# Patient Record
Sex: Male | Born: 1950 | Race: White | Hispanic: No | Smoking: Former smoker
Health system: Southern US, Community
[De-identification: ages and names within clinical notes are randomized; demographics above are authoritative.]

## PROBLEM LIST (undated history)

## (undated) DIAGNOSIS — C4490 Unspecified malignant neoplasm of skin, unspecified: Secondary | ICD-10-CM

## (undated) DIAGNOSIS — L409 Psoriasis, unspecified: Secondary | ICD-10-CM

## (undated) DIAGNOSIS — E119 Type 2 diabetes mellitus without complications: Secondary | ICD-10-CM

## (undated) DIAGNOSIS — E785 Hyperlipidemia, unspecified: Secondary | ICD-10-CM

## (undated) DIAGNOSIS — Z8739 Personal history of other diseases of the musculoskeletal system and connective tissue: Secondary | ICD-10-CM

## (undated) DIAGNOSIS — I10 Essential (primary) hypertension: Secondary | ICD-10-CM

## (undated) DIAGNOSIS — M1990 Unspecified osteoarthritis, unspecified site: Secondary | ICD-10-CM

## (undated) HISTORY — DX: Unspecified malignant neoplasm of skin, unspecified: C44.90

---

## 2005-07-29 ENCOUNTER — Ambulatory Visit: Payer: Self-pay | Admitting: Family Medicine

## 2007-05-12 ENCOUNTER — Ambulatory Visit: Payer: Self-pay | Admitting: Gastroenterology

## 2012-11-25 ENCOUNTER — Encounter (HOSPITAL_COMMUNITY): Payer: Self-pay | Admitting: Pharmacy Technician

## 2012-11-30 NOTE — H&P (Signed)
TOTAL KNEE ADMISSION H&P  Patient is being admitted for right total knee arthroplasty.  Subjective:  Chief Complaint:right knee pain.  HPI: [name] [name], 62 y.o. male, has a history of pain and functional disability in the right knee due to arthritis and has failed non-surgical conservative treatments for greater than 12 weeks to includeNSAID's and/or analgesics, corticosteriod injections, weight reduction as appropriate and activity modification.  Onset of symptoms was gradual, starting >10 years ago with gradually worsening course since that time. The patient noted no past surgery on the right knee(s).  Patient currently rates pain in the right knee(s) at 7 out of 10 with activity. Patient has night pain, worsening of pain with activity and weight bearing, pain that interferes with activities of daily living, pain with passive range of motion and crepitus.  Patient has evidence of periarticular osteophytes, joint subluxation and joint space narrowing by imaging studies.  There is no active infection.  Past Medical History Chronic Pain Diabetes Mellitus, Type II Gout Hypertension Hypercholesterolemia Osteoarthritis  Past Surgical History No pertinent surgical history  Current outpatient prescriptions: aspirin EC 81 MG tablet, Take 243 mg by mouth as directed. Patient is taking this 2-3 times per day for pain, Disp: , Rfl: ;   HYDROcodone-acetaminophen (NORCO) 7.5-325 MG per tablet, Take 1 tablet by mouth 2 (two) times daily., Disp: , Rfl: ;   ibuprofen (ADVIL,MOTRIN) 200 MG tablet, Take 200 mg by mouth every 6 (six) hours as needed for pain., Disp: , Rfl:  indomethacin (INDOCIN) 25 MG capsule, Take 25 mg by mouth as needed (Gout flare up)., Disp: , Rfl: lisinopril-hydrochlorothiazide (PRINZIDE,ZESTORETIC) 20-25 MG per tablet, Take 1 tablet by mouth daily before breakfast., Disp: , Rfl: ;   metFORMIN (GLUCOPHAGE) 850 MG tablet, Take 850 mg by mouth 2 (two) times daily with a meal., Disp: ,  Rfl: ;   simvastatin (ZOCOR) 40 MG tablet, Take 40 mg by mouth every evening., Disp: , Rfl:   No Known Allergies   History  Substance Use Topics  . Smoking status: Former smoker  . Smokeless tobacco: No  . Alcohol Use: Former drinker    Family History Father: deceased age 43; HTN Mother: living age 43 Brother: COPD  Review of Systems  Constitutional: Negative.   HENT: Positive for tinnitus. Negative for hearing loss, ear pain, nosebleeds, congestion, sore throat, neck pain and ear discharge.   Eyes: Negative.   Respiratory: Negative.  Negative for stridor.   Cardiovascular: Negative.   Gastrointestinal: Negative.   Musculoskeletal: Positive for back pain and joint pain. Negative for myalgias and falls.       Bilateral knee pain, right greater than left  Skin: Negative.   Neurological: Negative.  Negative for headaches.  Endo/Heme/Allergies: Negative.   Psychiatric/Behavioral: Negative.     Objective:  Physical Exam  Constitutional: He is oriented to person, place, and time. He appears well-developed and well-nourished. No distress.  HENT:  Head: Normocephalic and atraumatic.  Right Ear: External ear normal.  Left Ear: External ear normal.  Nose: Nose normal.  Mouth/Throat: Oropharynx is clear and moist.  Eyes: Conjunctivae and EOM are normal.  Neck: Normal range of motion. Neck supple. No tracheal deviation present. No thyromegaly present.  Cardiovascular: Normal rate, regular rhythm, normal heart sounds and intact distal pulses.   No murmur heard. Respiratory: Effort normal and breath sounds normal. No respiratory distress. He has no wheezes. He exhibits no tenderness.  GI: Soft. Bowel sounds are normal. He exhibits no distension and no  mass. There is no tenderness.  Musculoskeletal:       Right hip: Normal.       Left hip: Normal.       Right knee: He exhibits decreased range of motion. He exhibits no effusion and no erythema. Tenderness found. Medial joint line  tenderness noted.       Left knee: He exhibits decreased range of motion. He exhibits no effusion and no erythema. Tenderness found. Medial joint line tenderness noted.       Right lower leg: He exhibits no tenderness and no swelling.       Left lower leg: He exhibits no tenderness and no swelling.  Both knees have genu varus deformity. Right knee 0 to 95 degrees. Left knee 0 to 100 degrees.   Lymphadenopathy:    He has no cervical adenopathy.  Neurological: He is alert and oriented to person, place, and time. He has normal strength and normal reflexes. No sensory deficit.  Skin: No rash noted. He is not diaphoretic. No erythema.  Psychiatric: He has a normal mood and affect. His behavior is normal.    Vitals Weight: 298 lb Height: 68 in Body Surface Area: 2.55 m Body Mass Index: 45.31 kg/m Pulse: 88 (Regular) BP: 128/98 (Sitting, Left Arm, Standard)  Imaging Review Plain radiographs demonstrate severe degenerative joint disease of the right knee(s). The overall alignment issignificant varus. The bone quality appears to be good for age and reported activity level.  Assessment/Plan:  End stage arthritis, right knee   The patient history, physical examination, clinical judgment of the provider and imaging studies are consistent with end stage degenerative joint disease of the right knee(s) and total knee arthroplasty is deemed medically necessary. The treatment options including medical management, injection therapy arthroscopy and arthroplasty were discussed at length. The risks and benefits of total knee arthroplasty were presented and reviewed. The risks due to aseptic loosening, infection, stiffness, patella tracking problems, thromboembolic complications and other imponderables were discussed. The patient acknowledged the explanation, agreed to proceed with the plan and consent was signed. Patient is being admitted for inpatient treatment for surgery, pain control, PT, OT,  prophylactic antibiotics, VTE prophylaxis, progressive ambulation and ADL's and discharge planning. The patient is planning to be discharged home with home health services     [place], [place]

## 2012-12-01 ENCOUNTER — Ambulatory Visit (HOSPITAL_COMMUNITY)
Admission: RE | Admit: 2012-12-01 | Discharge: 2012-12-01 | Disposition: A | Discharge status: Home / Self Care | Payer: BC Managed Care – PPO | Source: Ambulatory Visit | Attending: Orthopedic Surgery | Admitting: Orthopedic Surgery

## 2012-12-01 ENCOUNTER — Encounter (HOSPITAL_COMMUNITY)
Admission: RE | Admit: 2012-12-01 | Discharge: 2012-12-01 | Disposition: A | Discharge status: Home / Self Care | Payer: BC Managed Care – PPO | Source: Ambulatory Visit | Attending: Orthopedic Surgery | Admitting: Orthopedic Surgery

## 2012-12-01 ENCOUNTER — Encounter (HOSPITAL_COMMUNITY): Payer: Self-pay

## 2012-12-01 DIAGNOSIS — E119 Type 2 diabetes mellitus without complications: Secondary | ICD-10-CM | POA: Insufficient documentation

## 2012-12-01 DIAGNOSIS — Z01812 Encounter for preprocedural laboratory examination: Secondary | ICD-10-CM | POA: Insufficient documentation

## 2012-12-01 DIAGNOSIS — I10 Essential (primary) hypertension: Secondary | ICD-10-CM | POA: Insufficient documentation

## 2012-12-01 DIAGNOSIS — Z01818 Encounter for other preprocedural examination: Secondary | ICD-10-CM | POA: Insufficient documentation

## 2012-12-01 DIAGNOSIS — M47812 Spondylosis without myelopathy or radiculopathy, cervical region: Secondary | ICD-10-CM | POA: Insufficient documentation

## 2012-12-01 DIAGNOSIS — M47814 Spondylosis without myelopathy or radiculopathy, thoracic region: Secondary | ICD-10-CM | POA: Insufficient documentation

## 2012-12-01 HISTORY — DX: Essential (primary) hypertension: I10

## 2012-12-01 HISTORY — DX: Unspecified osteoarthritis, unspecified site: M19.90

## 2012-12-01 HISTORY — DX: Type 2 diabetes mellitus without complications: E11.9

## 2012-12-01 LAB — COMPREHENSIVE METABOLIC PANEL
ALT: 12 U/L (ref 0–53)
AST: 24 U/L (ref 0–37)
Albumin: 3.9 g/dL (ref 3.5–5.2)
Alkaline Phosphatase: 54 U/L (ref 39–117)
BUN: 13 mg/dL (ref 6–23)
CO2: 27 mEq/L (ref 19–32)
Calcium: 9.3 mg/dL (ref 8.4–10.5)
Chloride: 103 mEq/L (ref 96–112)
Creatinine, Ser: 0.9 mg/dL (ref 0.50–1.35)
GFR calc Af Amer: >90 mL/min (ref >90)
GFR calc non Af Amer: >90 mL/min (ref >90)
Glucose, Bld: 124 mg/dL — ABNORMAL HIGH (ref 70–99)
Potassium: 4.5 mEq/L (ref 3.5–5.1)
Sodium: 139 mEq/L (ref 135–145)
Total Bilirubin: 0.3 mg/dL (ref 0.3–1.2)
Total Protein: 7.1 g/dL (ref 6.0–8.3)

## 2012-12-01 LAB — URINALYSIS, ROUTINE W REFLEX MICROSCOPIC
Bilirubin Urine: NEGATIVE
Glucose, UA: NEGATIVE mg/dL
Hgb urine dipstick: NEGATIVE
Ketones, ur: NEGATIVE mg/dL
Leukocytes, UA: NEGATIVE
Nitrite: NEGATIVE
Protein, ur: NEGATIVE mg/dL
Specific Gravity, Urine: 1.014 (ref 1.005–1.030)
Urobilinogen, UA: 0.2 mg/dL (ref 0.0–1.0)
pH: 5.5 (ref 5.0–8.0)

## 2012-12-01 LAB — APTT: aPTT: 31 seconds (ref 24–37)

## 2012-12-01 LAB — CBC
HCT: 40.2 % (ref 39.0–52.0)
MCH: 27 pg (ref 26.0–34.0)
MCV: 81 fL (ref 78.0–100.0)
Platelets: 297 10*3/uL (ref 150–400)
RDW: 13.3 % (ref 11.5–15.5)

## 2012-12-01 LAB — PROTIME-INR
INR: 0.92 (ref 0.00–1.49)
Prothrombin Time: 12.3 seconds (ref 11.6–15.2)

## 2012-12-01 LAB — ABO/RH: ABO/RH(D): O POS

## 2012-12-01 NOTE — Patient Instructions (Addendum)
20 MONTEY EBEL  12/01/2012   Your procedure is scheduled on:  3-5 -2014  Report to [facility] at     0900   AM.  Call this number if you have problems the morning of surgery: [phone]  Or Presurgical Testing [phone](Madalen Gavin)      Do not eat food:After Midnight.    Take these medicines the morning of surgery with A SIP OF WATER: Hydrocodone   Do not wear jewelry, make-up or nail polish.  Do not wear lotions, powders, or perfumes. You may wear deodorant.  Do not shave 12 hours prior to first CHG shower(legs and under arms).(face and neck okay.)  Do not bring valuables to the hospital.  Contacts, dentures or bridgework,body piercing,  may not be worn into surgery.  Leave suitcase in the car. After surgery it may be brought to your room.  For patients admitted to the hospital, checkout time is 11:00 AM the day of discharge.   Patients discharged the day of surgery will not be allowed to drive home. Must have responsible person with you x 24 hours once discharged.  Name and phone number of your driver: tyris, eliot- [phone] cell  Special Instructions: CHG(Chlorhedine 4%-"Hibiclens","Betasept","Aplicare") Shower Use Special Wash: see special instructions.(avoid face and genitals)   Please read over the following fact sheets that you were given: MRSA Information, Blood Transfusion fact sheet, Incentive Spirometry Instruction.    Failure to follow these instructions may result in Cancellation of your surgery.   Patient signature_______________________________________________________

## 2012-12-01 NOTE — Progress Notes (Signed)
12/01/12 1456  OBSTRUCTIVE SLEEP APNEA  Have you ever been diagnosed with sleep apnea through a sleep study? No  Do you snore loudly (loud enough to be heard through closed doors)?  0  Do you often feel tired, fatigued, or sleepy during the daytime? 0  Has anyone observed you stop breathing during your sleep? 0  Do you have, or are you being treated for high blood pressure? 1  BMI more than 35 kg/m2? 1  Age over 61 years old? 1  Neck circumference greater than 40 cm/18 inches? 1 (neck 18.5 inches)  Gender: 1  Obstructive Sleep Apnea Score 5  Score 4 or greater  Results sent to PCP (fax sent Dr. Sheppard Penton [phone] [facility])

## 2012-12-01 NOTE — Pre-Procedure Instructions (Addendum)
12-01-12 EKG 9'13/ Stress test-EKG 2'14-reports. CXR done today. 12-01-12 Stop/Bang score 5 faxed Dr. Michelene Gardener. 12-06-12 1255 Patient notified per voice message of surgery time change to 0830 AM, aware to arrive by 0615 AM to Short Stay. W. Kennon Portela

## 2012-12-08 ENCOUNTER — Inpatient Hospital Stay (HOSPITAL_COMMUNITY)
Admission: RE | Admit: 2012-12-08 | Discharge: 2012-12-11 | DRG: 209 | Disposition: A | Discharge status: Home Health | Payer: BC Managed Care – PPO | Source: Ambulatory Visit | Attending: Orthopedic Surgery | Admitting: Orthopedic Surgery

## 2012-12-08 ENCOUNTER — Encounter (HOSPITAL_COMMUNITY)
Admission: RE | Disposition: A | Discharge status: Home Health | Payer: Self-pay | Source: Ambulatory Visit | Attending: Orthopedic Surgery

## 2012-12-08 ENCOUNTER — Inpatient Hospital Stay (HOSPITAL_COMMUNITY): Payer: BC Managed Care – PPO

## 2012-12-08 ENCOUNTER — Encounter (HOSPITAL_COMMUNITY): Payer: Self-pay | Admitting: *Deleted

## 2012-12-08 ENCOUNTER — Inpatient Hospital Stay (HOSPITAL_COMMUNITY): Payer: BC Managed Care – PPO | Admitting: Anesthesiology

## 2012-12-08 ENCOUNTER — Encounter (HOSPITAL_COMMUNITY): Payer: Self-pay | Admitting: Anesthesiology

## 2012-12-08 DIAGNOSIS — Z6841 Body Mass Index (BMI) 40.0 and over, adult: Secondary | ICD-10-CM

## 2012-12-08 DIAGNOSIS — M1710 Unilateral primary osteoarthritis, unspecified knee: Principal | ICD-10-CM | POA: Diagnosis present

## 2012-12-08 DIAGNOSIS — Z87891 Personal history of nicotine dependence: Secondary | ICD-10-CM

## 2012-12-08 DIAGNOSIS — E119 Type 2 diabetes mellitus without complications: Secondary | ICD-10-CM | POA: Diagnosis present

## 2012-12-08 DIAGNOSIS — M24569 Contracture, unspecified knee: Secondary | ICD-10-CM | POA: Diagnosis present

## 2012-12-08 DIAGNOSIS — I10 Essential (primary) hypertension: Secondary | ICD-10-CM | POA: Diagnosis present

## 2012-12-08 DIAGNOSIS — Z96651 Presence of right artificial knee joint: Secondary | ICD-10-CM

## 2012-12-08 HISTORY — PX: TOTAL KNEE ARTHROPLASTY: SHX125

## 2012-12-08 LAB — GLUCOSE, CAPILLARY
Glucose-Capillary: 129 mg/dL — ABNORMAL HIGH (ref 70–99)
Glucose-Capillary: 204 mg/dL — ABNORMAL HIGH (ref 70–99)
Glucose-Capillary: 205 mg/dL — ABNORMAL HIGH (ref 70–99)

## 2012-12-08 LAB — TYPE AND SCREEN
ABO/RH(D): O POS
Antibody Screen: NEGATIVE

## 2012-12-08 SURGERY — ARTHROPLASTY, KNEE, TOTAL
Anesthesia: General | Site: Knee | Laterality: Right | Wound class: Clean

## 2012-12-08 MED ORDER — PHENOL 1.4 % MT LIQD: 1.0000 | OROMUCOSAL | Status: DC | PRN

## 2012-12-08 MED ORDER — OXYCODONE-ACETAMINOPHEN 5-325 MG PO TABS
2.0000 | ORAL_TABLET | ORAL | Status: DC | PRN
  Administered 2012-12-08 – 2012-12-11 (×16): 2 via ORAL
  Filled 2012-12-08 (×16): qty 2

## 2012-12-08 MED ORDER — LISINOPRIL-HYDROCHLOROTHIAZIDE 20-25 MG PO TABS: 1.0000 | ORAL_TABLET | Freq: Every day | ORAL | Status: DC

## 2012-12-08 MED ORDER — ALUM & MAG HYDROXIDE-SIMETH 200-200-20 MG/5ML PO SUSP: 30.0000 mL | ORAL | Status: DC | PRN

## 2012-12-08 MED ORDER — HYDROCODONE-ACETAMINOPHEN 5-325 MG PO TABS
1.0000 | ORAL_TABLET | ORAL | Status: DC | PRN
Start: 2012-12-08 — End: 2012-12-11

## 2012-12-08 MED ORDER — ONDANSETRON HCL 4 MG/2ML IJ SOLN
INTRAMUSCULAR | Status: DC | PRN
  Administered 2012-12-08: 4 mg via INTRAVENOUS

## 2012-12-08 MED ORDER — THROMBIN 5000 UNITS EX SOLR
CUTANEOUS | Status: DC | PRN
  Administered 2012-12-08: 5000 [IU] via TOPICAL

## 2012-12-08 MED ORDER — PROPOFOL 10 MG/ML IV BOLUS
INTRAVENOUS | Status: DC | PRN
  Administered 2012-12-08: 250 mg via INTRAVENOUS

## 2012-12-08 MED ORDER — HYDROMORPHONE HCL PF 1 MG/ML IJ SOLN
0.2500 mg | INTRAMUSCULAR | Status: DC | PRN
  Administered 2012-12-08 (×6): 0.5 mg via INTRAVENOUS

## 2012-12-08 MED ORDER — ONDANSETRON HCL 4 MG/2ML IJ SOLN
4.0000 mg | Freq: Four times a day (QID) | INTRAMUSCULAR | Status: DC | PRN
  Filled 2012-12-08: qty 2

## 2012-12-08 MED ORDER — LACTATED RINGERS IV SOLN
INTRAVENOUS | Status: DC | PRN
  Administered 2012-12-08 (×2): via INTRAVENOUS

## 2012-12-08 MED ORDER — INSULIN ASPART 100 UNIT/ML ~~LOC~~ SOLN
0.0000 [IU] | Freq: Three times a day (TID) | SUBCUTANEOUS | Status: DC
  Administered 2012-12-09 – 2012-12-10 (×5): 3 [IU] via SUBCUTANEOUS
  Administered 2012-12-11: 2 [IU] via SUBCUTANEOUS
  Administered 2012-12-11: 3 [IU] via SUBCUTANEOUS

## 2012-12-08 MED ORDER — MEPERIDINE HCL 50 MG/ML IJ SOLN: 6.2500 mg | INTRAMUSCULAR | Status: DC | PRN

## 2012-12-08 MED ORDER — LABETALOL HCL 5 MG/ML IV SOLN
INTRAVENOUS | Status: DC | PRN
  Administered 2012-12-08 (×2): 5 mg via INTRAVENOUS

## 2012-12-08 MED ORDER — HYDROMORPHONE HCL PF 1 MG/ML IJ SOLN
INTRAMUSCULAR | Status: DC | PRN
  Administered 2012-12-08: 2 mg via INTRAVENOUS

## 2012-12-08 MED ORDER — RIVAROXABAN 10 MG PO TABS
10.0000 mg | ORAL_TABLET | Freq: Every day | ORAL | Status: DC
  Administered 2012-12-09 – 2012-12-11 (×3): 10 mg via ORAL
  Filled 2012-12-08 (×5): qty 1

## 2012-12-08 MED ORDER — MIDAZOLAM HCL 5 MG/5ML IJ SOLN
INTRAMUSCULAR | Status: DC | PRN
  Administered 2012-12-08: 2 mg via INTRAVENOUS

## 2012-12-08 MED ORDER — LACTATED RINGERS IV SOLN: INTRAVENOUS | Status: DC

## 2012-12-08 MED ORDER — SUCCINYLCHOLINE CHLORIDE 20 MG/ML IJ SOLN
INTRAMUSCULAR | Status: DC | PRN
  Administered 2012-12-08: 140 mg via INTRAVENOUS

## 2012-12-08 MED ORDER — CEFAZOLIN SODIUM 1-5 GM-% IV SOLN
1.0000 g | Freq: Four times a day (QID) | INTRAVENOUS | Status: AC
  Administered 2012-12-08 (×2): 1 g via INTRAVENOUS
  Filled 2012-12-08 (×2): qty 50

## 2012-12-08 MED ORDER — SODIUM CHLORIDE 0.9 % IR SOLN
Status: DC | PRN
  Administered 2012-12-08 (×2)

## 2012-12-08 MED ORDER — ACETAMINOPHEN 650 MG RE SUPP: 650.0000 mg | Freq: Four times a day (QID) | RECTAL | Status: DC | PRN

## 2012-12-08 MED ORDER — METHOCARBAMOL 100 MG/ML IJ SOLN: 500.0000 mg | Freq: Four times a day (QID) | INTRAVENOUS | Status: DC | PRN

## 2012-12-08 MED ORDER — ONDANSETRON HCL 4 MG PO TABS: 4.0000 mg | ORAL_TABLET | Freq: Four times a day (QID) | ORAL | Status: DC | PRN

## 2012-12-08 MED ORDER — BISACODYL 10 MG RE SUPP: 10.0000 mg | Freq: Every day | RECTAL | Status: DC | PRN

## 2012-12-08 MED ORDER — FENTANYL CITRATE 0.05 MG/ML IJ SOLN
INTRAMUSCULAR | Status: DC | PRN
  Administered 2012-12-08: 150 ug via INTRAVENOUS
  Administered 2012-12-08: 100 ug via INTRAVENOUS
  Administered 2012-12-08: 50 ug via INTRAVENOUS
  Administered 2012-12-08 (×2): 100 ug via INTRAVENOUS
  Administered 2012-12-08: 50 ug via INTRAVENOUS

## 2012-12-08 MED ORDER — CHLORHEXIDINE GLUCONATE 4 % EX LIQD
60.0000 mL | Freq: Once | CUTANEOUS | Status: DC
  Filled 2012-12-08: qty 60

## 2012-12-08 MED ORDER — LIDOCAINE HCL (CARDIAC) 20 MG/ML IV SOLN
INTRAVENOUS | Status: DC | PRN
  Administered 2012-12-08: 100 mg via INTRAVENOUS

## 2012-12-08 MED ORDER — SODIUM CHLORIDE 0.9 % IR SOLN
Status: DC | PRN
  Administered 2012-12-08: 3000 mL

## 2012-12-08 MED ORDER — ACETAMINOPHEN 10 MG/ML IV SOLN
INTRAVENOUS | Status: DC | PRN
  Administered 2012-12-08: 1000 mg via INTRAVENOUS

## 2012-12-08 MED ORDER — LISINOPRIL 20 MG PO TABS
20.0000 mg | ORAL_TABLET | Freq: Every day | ORAL | Status: DC
  Administered 2012-12-08 – 2012-12-11 (×4): 20 mg via ORAL
  Filled 2012-12-08 (×4): qty 1

## 2012-12-08 MED ORDER — FERROUS SULFATE 325 (65 FE) MG PO TABS
325.0000 mg | ORAL_TABLET | Freq: Three times a day (TID) | ORAL | Status: DC
  Administered 2012-12-08 – 2012-12-11 (×8): 325 mg via ORAL
  Filled 2012-12-08 (×11): qty 1

## 2012-12-08 MED ORDER — LACTATED RINGERS IV SOLN
INTRAVENOUS | Status: DC
  Administered 2012-12-08: 23:00:00 via INTRAVENOUS
  Administered 2012-12-08: 100 mL/h via INTRAVENOUS
  Administered 2012-12-09: 09:00:00 via INTRAVENOUS

## 2012-12-08 MED ORDER — MENTHOL 3 MG MT LOZG: 1.0000 | LOZENGE | OROMUCOSAL | Status: DC | PRN

## 2012-12-08 MED ORDER — FLEET ENEMA 7-19 GM/118ML RE ENEM: 1.0000 | ENEMA | Freq: Once | RECTAL | Status: AC | PRN

## 2012-12-08 MED ORDER — METFORMIN HCL 850 MG PO TABS
850.0000 mg | ORAL_TABLET | Freq: Two times a day (BID) | ORAL | Status: DC
  Administered 2012-12-08 – 2012-12-11 (×6): 850 mg via ORAL
  Filled 2012-12-08 (×8): qty 1

## 2012-12-08 MED ORDER — HYDROMORPHONE HCL PF 1 MG/ML IJ SOLN
1.0000 mg | INTRAMUSCULAR | Status: DC | PRN
  Administered 2012-12-08 – 2012-12-10 (×10): 1 mg via INTRAVENOUS
  Filled 2012-12-08 (×11): qty 1

## 2012-12-08 MED ORDER — HYDROCHLOROTHIAZIDE 25 MG PO TABS
25.0000 mg | ORAL_TABLET | Freq: Every day | ORAL | Status: DC
  Administered 2012-12-08 – 2012-12-11 (×4): 25 mg via ORAL
  Filled 2012-12-08 (×4): qty 1

## 2012-12-08 MED ORDER — SODIUM CHLORIDE 0.9 % IJ SOLN
INTRAMUSCULAR | Status: DC | PRN
  Administered 2012-12-08: 11:00:00

## 2012-12-08 MED ORDER — DEXTROSE 5 % IV SOLN
3.0000 g | INTRAVENOUS | Status: AC
  Administered 2012-12-08: 3 g via INTRAVENOUS
  Filled 2012-12-08: qty 3000

## 2012-12-08 MED ORDER — METHOCARBAMOL 500 MG PO TABS
500.0000 mg | ORAL_TABLET | Freq: Four times a day (QID) | ORAL | Status: DC | PRN
  Administered 2012-12-08 – 2012-12-11 (×11): 500 mg via ORAL
  Filled 2012-12-08 (×10): qty 1

## 2012-12-08 MED ORDER — ACETAMINOPHEN 325 MG PO TABS: 650.0000 mg | ORAL_TABLET | Freq: Four times a day (QID) | ORAL | Status: DC | PRN

## 2012-12-08 MED ORDER — BUPIVACAINE LIPOSOME 1.3 % IJ SUSP
20.0000 mL | Freq: Once | INTRAMUSCULAR | Status: DC
  Filled 2012-12-08: qty 20

## 2012-12-08 MED ORDER — SIMVASTATIN 40 MG PO TABS
40.0000 mg | ORAL_TABLET | Freq: Every day | ORAL | Status: DC
  Administered 2012-12-08 – 2012-12-10 (×3): 40 mg via ORAL
  Filled 2012-12-08 (×4): qty 1

## 2012-12-08 MED ORDER — POLYETHYLENE GLYCOL 3350 17 G PO PACK: 17.0000 g | PACK | Freq: Every day | ORAL | Status: DC | PRN

## 2012-12-08 MED ORDER — PROMETHAZINE HCL 25 MG/ML IJ SOLN: 6.2500 mg | INTRAMUSCULAR | Status: DC | PRN

## 2012-12-08 SURGICAL SUPPLY — 67 items
BAG ZIPLOCK 12X15 (MISCELLANEOUS) ×2 IMPLANT
BANDAGE ELASTIC 4 VELCRO ST LF (GAUZE/BANDAGES/DRESSINGS) ×2 IMPLANT
BANDAGE ELASTIC 6 VELCRO ST LF (GAUZE/BANDAGES/DRESSINGS) ×2 IMPLANT
BANDAGE ESMARK 6X9 LF (GAUZE/BANDAGES/DRESSINGS) ×1 IMPLANT
BANDAGE GAUZE ELAST BULKY 4 IN (GAUZE/BANDAGES/DRESSINGS) ×2 IMPLANT
BLADE SAG 18X100X1.27 (BLADE) ×2 IMPLANT
BLADE SAW SGTL 11.0X1.19X90.0M (BLADE) ×2 IMPLANT
BNDG ESMARK 6X9 LF (GAUZE/BANDAGES/DRESSINGS) ×2
BONE CEMENT GENTAMICIN (Cement) ×4 IMPLANT
CEMENT BONE GENTAMICIN 40 (Cement) ×2 IMPLANT
CLOTH BEACON ORANGE TIMEOUT ST (SAFETY) ×2 IMPLANT
CLSR STERI-STRIP ANTIMIC 1/2X4 (GAUZE/BANDAGES/DRESSINGS) ×4 IMPLANT
CUFF TOURN SGL QUICK 34 (TOURNIQUET CUFF) ×1
CUFF TRNQT CYL 34X4X40X1 (TOURNIQUET CUFF) ×1 IMPLANT
DRAPE EXTREMITY T 121X128X90 (DRAPE) ×2 IMPLANT
DRAPE INCISE IOBAN 66X45 STRL (DRAPES) ×2 IMPLANT
DRAPE LG THREE QUARTER DISP (DRAPES) ×2 IMPLANT
DRAPE POUCH INSTRU U-SHP 10X18 (DRAPES) ×2 IMPLANT
DRAPE U-SHAPE 47X51 STRL (DRAPES) ×2 IMPLANT
DRSG ADAPTIC 3X8 NADH LF (GAUZE/BANDAGES/DRESSINGS) ×2 IMPLANT
DRSG PAD ABDOMINAL 8X10 ST (GAUZE/BANDAGES/DRESSINGS) ×2 IMPLANT
DURAPREP 26ML APPLICATOR (WOUND CARE) ×2 IMPLANT
ELECT BLADE TIP CTD 4 INCH (ELECTRODE) ×2 IMPLANT
ELECT REM PT RETURN 9FT ADLT (ELECTROSURGICAL) ×2
ELECTRODE REM PT RTRN 9FT ADLT (ELECTROSURGICAL) ×1 IMPLANT
EVACUATOR 1/8 PVC DRAIN (DRAIN) ×2 IMPLANT
FACESHIELD LNG OPTICON STERILE (SAFETY) ×16 IMPLANT
GLOVE BIOGEL PI IND STRL 7.0 (GLOVE) ×3 IMPLANT
GLOVE BIOGEL PI IND STRL 8 (GLOVE) ×1 IMPLANT
GLOVE BIOGEL PI IND STRL 8.5 (GLOVE) IMPLANT
GLOVE BIOGEL PI INDICATOR 7.0 (GLOVE) ×3
GLOVE BIOGEL PI INDICATOR 8 (GLOVE) ×1
GLOVE BIOGEL PI INDICATOR 8.5 (GLOVE)
GLOVE ECLIPSE 8.0 STRL XLNG CF (GLOVE) ×4 IMPLANT
GLOVE SURG SS PI 6.5 STRL IVOR (GLOVE) ×6 IMPLANT
GLOVE SURG SS PI 7.0 STRL IVOR (GLOVE) ×4 IMPLANT
GOWN PREVENTION PLUS LG XLONG (DISPOSABLE) ×6 IMPLANT
GOWN STRL REIN XL XLG (GOWN DISPOSABLE) ×2 IMPLANT
HANDPIECE INTERPULSE COAX TIP (DISPOSABLE) ×1
IMMOBILIZER KNEE 20 (SOFTGOODS) ×2
IMMOBILIZER KNEE 20 THIGH 36 (SOFTGOODS) ×1 IMPLANT
KIT BASIN OR (CUSTOM PROCEDURE TRAY) ×2 IMPLANT
MANIFOLD NEPTUNE II (INSTRUMENTS) ×2 IMPLANT
NEEDLE HYPO 22GX1.5 SAFETY (NEEDLE) ×4 IMPLANT
NS IRRIG 1000ML POUR BTL (IV SOLUTION) ×2 IMPLANT
PACK TOTAL JOINT (CUSTOM PROCEDURE TRAY) ×2 IMPLANT
POSITIONER SURGICAL ARM (MISCELLANEOUS) ×2 IMPLANT
SET HNDPC FAN SPRY TIP SCT (DISPOSABLE) ×1 IMPLANT
SPONGE GAUZE 4X4 12PLY (GAUZE/BANDAGES/DRESSINGS) ×2 IMPLANT
SPONGE LAP 18X18 X RAY DECT (DISPOSABLE) ×6 IMPLANT
SPONGE SURGIFOAM ABS GEL 100 (HEMOSTASIS) ×2 IMPLANT
STAPLER VISISTAT 35W (STAPLE) IMPLANT
SUCTION FRAZIER 12FR DISP (SUCTIONS) ×2 IMPLANT
SUT BONE WAX W31G (SUTURE) ×2 IMPLANT
SUT MNCRL AB 4-0 PS2 18 (SUTURE) ×2 IMPLANT
SUT VIC AB 1 CT1 27 (SUTURE) ×2
SUT VIC AB 1 CT1 27XBRD ANTBC (SUTURE) ×2 IMPLANT
SUT VIC AB 2-0 CT1 27 (SUTURE) ×3
SUT VIC AB 2-0 CT1 TAPERPNT 27 (SUTURE) ×3 IMPLANT
SUT VLOC 180 0 24IN GS25 (SUTURE) ×2 IMPLANT
SYR 20CC LL (SYRINGE) ×2 IMPLANT
TOWEL OR 17X26 10 PK STRL BLUE (TOWEL DISPOSABLE) ×6 IMPLANT
TOWER CARTRIDGE SMART MIX (DISPOSABLE) ×2 IMPLANT
TRAY FOLEY CATH 14FRSI W/METER (CATHETERS) ×2 IMPLANT
TRAY TIB SZ 5 REVISION (Knees) ×2 IMPLANT
WATER STERILE IRR 1500ML POUR (IV SOLUTION) ×4 IMPLANT
WRAP KNEE MAXI GEL POST OP (GAUZE/BANDAGES/DRESSINGS) ×4 IMPLANT

## 2012-12-08 NOTE — Progress Notes (Signed)
Utilization review completed.  

## 2012-12-08 NOTE — Anesthesia Preprocedure Evaluation (Addendum)
Anesthesia Evaluation  Patient identified by MRN, date of birth, ID band Patient awake    Reviewed: Allergy & Precautions, H&P , NPO status , Patient's Chart, lab work & pertinent test results  Airway Mallampati: II TM Distance: >3 FB Neck ROM: Full    Dental no notable dental hx.    Pulmonary neg pulmonary ROS, former smoker,  breath sounds clear to auscultation  Pulmonary exam normal       Cardiovascular hypertension, Pt. on medications Rhythm:Regular Rate:Normal     Neuro/Psych negative neurological ROS  negative psych ROS   GI/Hepatic negative GI ROS, Neg liver ROS,   Endo/Other  diabetes, Oral Hypoglycemic AgentsMorbid obesity  Renal/GU negative Renal ROS  negative genitourinary   Musculoskeletal negative musculoskeletal ROS (+)   Abdominal   Peds negative pediatric ROS (+)  Hematology negative hematology ROS (+)   Anesthesia Other Findings   Reproductive/Obstetrics negative OB ROS                         Anesthesia Physical Anesthesia Plan  ASA: III  Anesthesia Plan: General   Post-op Pain Management:    Induction: Intravenous  Airway Management Planned: Oral ETT  Additional Equipment:   Intra-op Plan:   Post-operative Plan: Extubation in OR  Informed Consent: I have reviewed the patients History and Physical, chart, labs and discussed the procedure including the risks, benefits and alternatives for the proposed anesthesia with the patient or authorized representative who has indicated his/her understanding and acceptance.   Dental advisory given  Plan Discussed with: CRNA  Anesthesia Plan Comments:         Anesthesia Quick Evaluation

## 2012-12-08 NOTE — Preoperative (Signed)
Beta Blockers   Reason not to administer Beta Blockers:Not Applicable 

## 2012-12-08 NOTE — Anesthesia Postprocedure Evaluation (Signed)
  Anesthesia Post-op Note  Patient: [name] [name]  Procedure(s) Performed: Procedure(s) (LRB): TOTAL KNEE ARTHROPLASTY (Right)  Patient Location: PACU  Anesthesia Type: General  Level of Consciousness: awake and alert   Airway and Oxygen Therapy: Patient Spontanous Breathing  Post-op Pain: mild  Post-op Assessment: Post-op Vital signs reviewed, Patient's Cardiovascular Status Stable, Respiratory Function Stable, Patent Airway and No signs of Nausea or vomiting  Last Vitals:  Filed Vitals:   12/08/12 0804  BP: 152/93  Temp: 36.4 C  Resp: 20    Post-op Vital Signs: stable   Complications: No apparent anesthesia complications

## 2012-12-08 NOTE — Brief Op Note (Signed)
,  12/08/2012  11:17 AM  PATIENT:  [name] [name]  62 y.o. male  PRE-OPERATIVE DIAGNOSIS:  OA RIGHT KNEE,Complex   POST-OPERATIVE DIAGNOSIS:  OA RIGHT KNEE ,Complex  PROCEDURE:  Procedure(s): TOTAL KNEE ARTHROPLASTY (Right)  SURGEON:  Surgeon(s) and Role:    * Jacki Cones, MD - Primary  PHYSICIAN ASSISTANT: Dimitri Ped PA  ASSISTANTS: Dimitri Ped PA  ANESTHESIA:   general  EBL:  Total I/O In: 1000 [I.V.:1000] Out: 100 [Urine:100]  BLOOD ADMINISTERED:none  DRAINS: (One) Hemovact drain(s) in the Right Knee with  Suction Open   LOCAL MEDICATIONS USED: Bupivicaine 20cc mixed with 20 cc of Normal Saline.  SPECIMEN:  No Specimen  DISPOSITION OF SPECIMEN:  N/A  COUNTS:  YES  TOURNIQUET:  * Missing tourniquet times found for documented tourniquets in log:  85552 *  DICTATION: .Other Dictation: Dictation Number [id number]  PLAN OF CARE: Admit to inpatient   PATIENT DISPOSITION:  Stable in OR   Delay start of Pharmacological VTE agent (>24hrs) due to surgical blood loss or risk of bleeding: yes

## 2012-12-08 NOTE — Transfer of Care (Signed)
Immediate Anesthesia Transfer of Care Note  Patient: [name] [name]  Procedure(s) Performed: Procedure(s): TOTAL KNEE ARTHROPLASTY (Right)  Patient Location: PACU  Anesthesia Type:General  Level of Consciousness: awake, alert , oriented, patient cooperative and responds to stimulation  Airway & Oxygen Therapy: Patient Spontanous Breathing and Patient connected to face mask oxygen  Post-op Assessment: Report given to PACU RN and Post -op Vital signs reviewed and stable  Post vital signs: Reviewed and stable  Complications: No apparent anesthesia complications

## 2012-12-08 NOTE — Progress Notes (Signed)
PT Cancellation Note  Patient Details Name: CUTLER SUNDAY MRN: [id number] DOB: 1951/02/03   Cancelled Treatment:    Reason Eval/Treat Not Completed: Pain limiting ability to participate   Rada Hay 12/08/2012, 4:03 PM

## 2012-12-08 NOTE — Interval H&P Note (Signed)
History and Physical Interval Note:  12/08/2012 8:35 AM  [name] [name]  has presented today for surgery, with the diagnosis of OA RIGHT KNEE   The various methods of treatment have been discussed with the patient and family. After consideration of risks, benefits and other options for treatment, the patient has consented to  Procedure(s): TOTAL KNEE ARTHROPLASTY (Right) as a surgical intervention .  The patient's history has been reviewed, patient examined, no change in status, stable for surgery.  I have reviewed the patient's chart and labs.  Questions were answered to the patient's satisfaction.     GIOFFRE,RONALD A

## 2012-12-09 ENCOUNTER — Encounter (HOSPITAL_COMMUNITY): Payer: Self-pay | Admitting: Orthopedic Surgery

## 2012-12-09 LAB — GLUCOSE, CAPILLARY
Glucose-Capillary: 190 mg/dL — ABNORMAL HIGH (ref 70–99)
Glucose-Capillary: 182 mg/dL — ABNORMAL HIGH (ref 70–99)
Glucose-Capillary: 188 mg/dL — ABNORMAL HIGH (ref 70–99)
Glucose-Capillary: 178 mg/dL — ABNORMAL HIGH (ref 70–99)

## 2012-12-09 LAB — BASIC METABOLIC PANEL
BUN: 8 mg/dL (ref 6–23)
CO2: 29 mEq/L (ref 19–32)
Chloride: 95 mEq/L — ABNORMAL LOW (ref 96–112)
GFR calc Af Amer: >90 mL/min (ref >90)
Potassium: 4.1 mEq/L (ref 3.5–5.1)

## 2012-12-09 LAB — CBC
HCT: 34.1 % — ABNORMAL LOW (ref 39.0–52.0)
Platelets: 229 10*3/uL (ref 150–400)
RBC: 4.18 MIL/uL — ABNORMAL LOW (ref 4.22–5.81)
RDW: 13.4 % (ref 11.5–15.5)
WBC: 7.1 10*3/uL (ref 4.0–10.5)

## 2012-12-09 MED ORDER — FERROUS SULFATE 325 (65 FE) MG PO TABS: 325.0000 mg | ORAL_TABLET | Freq: Three times a day (TID) | ORAL | Status: DC

## 2012-12-09 MED ORDER — METHOCARBAMOL 500 MG PO TABS: 500.0000 mg | ORAL_TABLET | Freq: Four times a day (QID) | ORAL | Status: DC | PRN

## 2012-12-09 MED ORDER — OXYCODONE-ACETAMINOPHEN 5-325 MG PO TABS: 2.0000 | ORAL_TABLET | ORAL | Status: DC | PRN

## 2012-12-09 MED ORDER — RIVAROXABAN 10 MG PO TABS: 10.0000 mg | ORAL_TABLET | Freq: Every day | ORAL | Status: DC

## 2012-12-09 NOTE — Evaluation (Signed)
Physical Therapy Evaluation Patient Details Name: [name] [name] MRN: [id number] DOB: 12-28-1950 Today's Date: 12/09/2012 Time: 0850-0910 PT Time Calculation (min): 20 min  PT Assessment / Plan / Recommendation Clinical Impression  62 yo male s/p R TKA. Mobilizing fairly well. c/o increased pain. On eval pt required Min assist for mobility and ambulation distance of 15 feet. Recommend HHPT and supervision for OOB/mobility.    PT Assessment  Patient needs continued PT services    Follow Up Recommendations  Home health PT;Supervision for mobility/OOB    Does the patient have the potential to tolerate intense rehabilitation      Barriers to Discharge        Equipment Recommendations  Rolling walker with 5" wheels    Recommendations for Other Services OT consult   Frequency 7X/week    Precautions / Restrictions Precautions Precautions: Knee Required Braces or Orthoses: Knee Immobilizer - Right Knee Immobilizer - Right: Discontinue once straight leg raise with < 10 degree lag Restrictions Weight Bearing Restrictions: No RLE Weight Bearing: Weight bearing as tolerated   Pertinent Vitals/Pain 5/10 R knee      Mobility  Bed Mobility Bed Mobility: Supine to Sit Supine to Sit: HOB elevated;With rails;4: Min assist Details for Bed Mobility Assistance: assist for L LE off bed. VCs safety Transfers Transfers: Sit to Stand;Stand to Sit Sit to Stand: 4: Min assist;From elevated surface;From bed Stand to Sit: To chair/3-in-1;4: Min assist;With armrests Details for Transfer Assistance: VCs safety, technique, hand placement. Assist to rise, stabilize, control descent.  Ambulation/Gait Ambulation/Gait Assistance: 4: Min assist Ambulation Distance (Feet): 15 Feet Assistive device: Rolling walker Ambulation/Gait Assistance Details: VCS safety, technique, sequence, distance from RW. Assist to stabilize throughout ambulaiton.  Gait Pattern: Step-to pattern;Step-through pattern;Decreased  stride length;Decreased step length - left;Decreased step length - right    Exercises     PT Diagnosis: Difficulty walking;Abnormality of gait;Acute pain  PT Problem List: Decreased strength;Decreased range of motion;Decreased activity tolerance;Decreased mobility;Obesity;Pain;Decreased knowledge of use of DME;Decreased knowledge of precautions PT Treatment Interventions: DME instruction;Gait training;Stair training;Functional mobility training;Therapeutic activities;Therapeutic exercise;Patient/family education   PT Goals Acute Rehab PT Goals PT Goal Formulation: With patient Time For Goal Achievement: 12/16/12 Potential to Achieve Goals: Good Pt will go Supine/Side to Sit: with supervision PT Goal: Supine/Side to Sit - Progress: Goal set today Pt will go Sit to Supine/Side: with supervision PT Goal: Sit to Supine/Side - Progress: Goal set today Pt will go Sit to Stand: with supervision PT Goal: Sit to Stand - Progress: Goal set today Pt will Ambulate: 51 - 150 feet;with supervision;with rolling walker PT Goal: Ambulate - Progress: Goal set today Pt will Go Up / Down Stairs: 3-5 stairs;with rail(s);with least restrictive assistive device;with min assist PT Goal: Up/Down Stairs - Progress: Goal set today Pt will Perform Home Exercise Program: with supervision, verbal cues required/provided PT Goal: Perform Home Exercise Program - Progress: Goal set today  Visit Information  Last PT Received On: 12/09/12 Assistance Needed: +1    Subjective Data  Subjective: It hurts Patient Stated Goal: less pain. home   Prior Functioning  Home Living Lives With: Spouse Available Help at Discharge: Family Type of Home: House Home Access: Stairs to enter Secretary/administrator of Steps: 3 Entrance Stairs-Rails: Right Home Layout: One level Home Adaptive Equipment: None Prior Function Level of Independence: Independent Able to Take Stairs?: Yes Driving: Yes Communication Communication:  No difficulties    Cognition  Cognition Overall Cognitive Status: Appears within functional limits for tasks assessed/performed  Arousal/Alertness: Awake/alert Orientation Level: Appears intact for tasks assessed Behavior During Session: [facility] for tasks performed    Extremity/Trunk Assessment Right Lower Extremity Assessment RLE ROM/Strength/Tone: Deficits RLE ROM/Strength/Tone Deficits: hip flex 2/5, hip abd/add 2/5, moves ankle well Left Lower Extremity Assessment LLE ROM/Strength/Tone: WFL for tasks assessed Trunk Assessment Trunk Assessment: Normal   Balance    End of Session PT - End of Session Equipment Utilized During Treatment: Gait belt;Right knee immobilizer Activity Tolerance: Patient limited by pain Patient left: in chair;with call bell/phone within reach  GP     Rebeca Alert, MPT Pager: [phone]

## 2012-12-09 NOTE — Progress Notes (Signed)
Physical Therapy Treatment Patient Details Name: [name] [name] MRN: [id number] DOB: 1951/03/08 Today's Date: 12/09/2012 Time: 4098-1191 PT Time Calculation (min): 24 min  PT Assessment / Plan / Recommendation Comments on Treatment Session  Progressing well. Recommend HHPT.     Follow Up Recommendations  Home health PT;Supervision for mobility/OOB     Does the patient have the potential to tolerate intense rehabilitation     Barriers to Discharge        Equipment Recommendations  Rolling walker with 5" wheels    Recommendations for Other Services OT consult  Frequency 7X/week   Plan Discharge plan remains appropriate    Precautions / Restrictions Precautions Precautions: Knee Required Braces or Orthoses: Knee Immobilizer - Right Knee Immobilizer - Right: Discontinue once straight leg raise with < 10 degree lag Restrictions Weight Bearing Restrictions: No RLE Weight Bearing: Weight bearing as tolerated   Pertinent Vitals/Pain 5/10 R knee    Mobility  Bed Mobility Supine to Sit: 4: Min assist Details for Bed Mobility Assistance: assist for L LE onto bed. VCs safety Transfers Transfers: Sit to Stand;Stand to Sit Sit to Stand: 4: Min assist;From chair/3-in-1 Stand to Sit: 4: Min guard;To bed Details for Transfer Assistance: VCs safety, technique, hand placement. Assist to rise, stabilize Ambulation/Gait Ambulation/Gait Assistance: 4: Min guard Ambulation Distance (Feet): 60 Feet Assistive device: Rolling walker Ambulation/Gait Assistance Details: VCs safety, technique, sequence, step length.  Gait Pattern: Step-to pattern;Decreased stride length;Decreased step length - right;Antalgic    Exercises Total Joint Exercises Ankle Circles/Pumps: AROM;Both;10 reps;Supine Quad Sets: AROM;Both;10 reps;Supine Short Arc Quad: AAROM;Right;10 reps;Supine Heel Slides: AAROM;Right;10 reps;Supine Hip ABduction/ADduction: AAROM;Right;10 reps;Supine Straight Leg Raises: AAROM;Right;10  reps;Supine   PT Diagnosis: Difficulty walking;Abnormality of gait;Acute pain  PT Problem List: Decreased strength;Decreased range of motion;Decreased activity tolerance;Decreased mobility;Obesity;Pain;Decreased knowledge of use of DME;Decreased knowledge of precautions PT Treatment Interventions: DME instruction;Gait training;Stair training;Functional mobility training;Therapeutic activities;Therapeutic exercise;Patient/family education   PT Goals Acute Rehab PT Goals PT Goal Formulation: With patient Time For Goal Achievement: 12/16/12 Potential to Achieve Goals: Good Pt will go Supine/Side to Sit: with supervision PT Goal: Supine/Side to Sit - Progress: Goal set today Pt will go Sit to Supine/Side: with supervision PT Goal: Sit to Supine/Side - Progress: Progressing toward goal Pt will go Sit to Stand: with supervision PT Goal: Sit to Stand - Progress: Progressing toward goal Pt will Ambulate: 51 - 150 feet;with supervision;with rolling walker PT Goal: Ambulate - Progress: Progressing toward goal Pt will Go Up / Down Stairs: 3-5 stairs;with rail(s);with least restrictive assistive device;with min assist PT Goal: Up/Down Stairs - Progress: Goal set today Pt will Perform Home Exercise Program: with supervision, verbal cues required/provided PT Goal: Perform Home Exercise Program - Progress: Progressing toward goal  Visit Information  Last PT Received On: 12/09/12 Assistance Needed: +1    Subjective Data  Subjective: Its so tender Patient Stated Goal: less pain. home   Cognition  Cognition Overall Cognitive Status: Appears within functional limits for tasks assessed/performed Arousal/Alertness: Awake/alert Orientation Level: Appears intact for tasks assessed Behavior During Session: [facility] for tasks performed    Balance     End of Session PT - End of Session Equipment Utilized During Treatment: Right knee immobilizer Activity Tolerance: Patient tolerated treatment  well Patient left: in bed;with call bell/phone within reach;with family/visitor present   GP     Rebeca Alert, MPT Pager: [phone]

## 2012-12-09 NOTE — Progress Notes (Signed)
Subjective: 1 Day Post-Op Procedure(s) (LRB): TOTAL KNEE ARTHROPLASTY (Right) Patient reports pain as 4 on 0-10 scale.Slight Tilt in Tibial Component due to Canal Deformity in Proximal canal and canal was extremely Sclerotic, Despite this his varus attitude is improved compared to Pre-Op .Dressiing changed and hemovac pulled and wound looks fine. Will ambulate and DC Sat.    Objective: Vital signs in last 24 hours: Temp:  [97.6 F (36.4 C)-99.6 F (37.6 C)] 98.7 F (37.1 C) (03/06 0644) Pulse Rate:  [81-100] 95 (03/06 0644) Resp:  [13-20] 16 (03/06 0644) BP: (120-188)/(52-118) 123/73 mmHg (03/06 0644) SpO2:  [94 %-100 %] 98 % (03/06 0644) Weight:  [134.718 kg (297 lb)] 134.718 kg (297 lb) (03/05 1341)  Intake/Output from previous day: 03/05 0701 - 03/06 0700 In: 3578.3 [I.V.:3578.3] Out: 5355 [Urine:4900; Drains:405; Blood:50] Intake/Output this shift:     Recent Labs  12/09/12 0415  HGB 11.5*    Recent Labs  12/09/12 0415  WBC 7.1  RBC 4.18*  HCT 34.1*  PLT 229    Recent Labs  12/09/12 0415  NA 133*  K 4.1  CL 95*  CO2 29  BUN 8  CREATININE 0.93  GLUCOSE 188*  CALCIUM 8.5   No results found for this basename: LABPT, INR,  in the last 72 hours  Dorsiflexion/Plantar flexion intact No cellulitis present Compartment soft  Assessment/Plan: 1 Day Post-Op Procedure(s) (LRB): TOTAL KNEE ARTHROPLASTY (Right) Up with therapy  GIOFFRE,RONALD A 12/09/2012, 7:13 AM

## 2012-12-09 NOTE — Care Management Note (Signed)
    Page 1 of 2   12/10/2012     4:11:10 PM   CARE MANAGEMENT NOTE 12/10/2012  Patient:  [name] [name], [name] [name]   Account Number:  [id number]  Date Initiated:  12/09/2012  Documentation initiated by:  Colleen Can  Subjective/Objective Assessment:   dx total knee replacemnt-right     Action/Plan:   CM spokke with patient. Plans are for his to retiurn to his home in [place] where family will provide his care. He already has DME-RW in room. States he was told 3n1 to be delivered. Wants [name] for [facility] services.   Anticipated DC Date:  12/10/2012   Anticipated DC Plan:  HOME W HOME HEALTH SERVICES      DC Planning Services  CM consult      [facility] Choice  HOME HEALTH   Choice offered to / List presented to:  C-1 Patient   DME arranged  [facility]  3-N-1      DME agency  [facility]     HH arranged  HH-2 PT      [facility] agency  [facility]   Status of service:  Completed, signed off Medicare Important Message given?   (If response is "NO", the following Medicare IM given date fields will be blank) Date Medicare IM given:   Date Additional Medicare IM given:    Discharge Disposition:    Per UR Regulation:  Reviewed for med. necessity/level of care/duration of stay  If discussed at Long Length of Stay Meetings, dates discussed:    Comments:  12/10/2012 Colleen Can [phone] Anticipate d/c 12/11/2012. [name]  will provide HHpt services with start date of Sunday 12/307/2014.

## 2012-12-09 NOTE — Progress Notes (Signed)
Patient ate Echo for lunch before staff assessed CBG. Will monitor at Dinnertime and administer novolog as needed. Patient states he "usually takes metformin, and I feel fine." Will monitor closely for signs of hyperglycemia. Curry Dulski Lynder Parents, RN 11:44 AM 12/09/2012

## 2012-12-09 NOTE — Op Note (Signed)
NAMEDEVELLE, SIEVERS NO.:  [id number]  MEDICAL RECORD NO.:  [id number]  LOCATION:  1619                         FACILITY:  [facility]  PHYSICIAN:  Georges Lynch. Gioffre, M.D.DATE OF BIRTH:  [birth year]  DATE OF PROCEDURE:  12/08/2012 DATE OF DISCHARGE:                              OPERATIVE REPORT   SURGEON:  Georges Lynch. 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  He weighed 298 pounds.  POSTOPERATIVE DIAGNOSIS:  PROCEDURE:  Right total knee arthroplasty utilizing the DePuy system.  I cemented all 3 components.  The patella size was a size 41 with 3 pegs. The femur was a size 5, right femur  the tibial tray was a size 5 tray. The insert was a 12.5 mm thickness size 5, rotating platform.  PROCEDURE IN DETAIL:  Under general anesthesia, routine orthopedic prep and draping of the right lower extremity was carried out.  The appropriate time-out was carried out first.  Prior to that I marked the appropriate right leg in the holding area.  At this time, the leg was exsanguinated and Esmarch tourniquet was elevated to 350 mmHg. Following that, the knee was flexed an incision was made over the anterior aspect of the right knee.  Bleeders were identified and cauterized.  Two flaps were created.  I then carried out a median parapatellar approach.  I reflected the patella laterally flexed the knee, did medial and lateral meniscectomies and excised the anterior and posterior cruciate ligament.  Note, his knee was extremely tight, it was extremely complex because of his body weight and the contractures, I did a significant amount of soft tissue release, especially in the medial compartment because of his varus deformity.  After this, we made our initial cut, removed 13 mm off the distal femur.  Following that, we then inserted our next guide,  for measurement purposes we measured the femur to be a size 5.  We then cut the femur for a size 5, we did anterior, posterior, and chamfering cuts.  Note, the bone was highlight, it extremely hard.  We literally went through about 5 batteries in the operating room because of the degree of hardness of the bone.  Once the femur was prepared, we then prepared the tibia in the usual fashion.  We measured the fit the tibia to be a size 5.  We then was completed our meniscectomies after inserting the Lamina spreaders.  At this time, we removed all posterior bone spurs as well.  We thoroughly irrigated out the knee.  With our tension devices, we utilized a 12.5 thickness insert.  We had good stability with the tension guides.  At this time, we then went on to complete a preparation of our tibia.  We put a long stemmed tibia here, we did appropriate drill holes for a long stemmed tibia because of his body size.  We removed the appropriate amount of bone from the tibial plateau as well.  We cut our keel cut.  After this was done, we then prepared our femur  by doing our notch cut.  After this was done, we inserted our trial components.  We first tried a 10 mm thickness, then went to a 12.5-mm thickness rotating platform. Following that, we then prepared the patella and did a resurfacing procedure on the patella in the usual fashion.  Three drill holes were made in the patella for a size 41 patella.  All trial components were removed.  We thoroughly water picked out the knee, cemented all 3 components in simultaneously.  The extra pieces of cement then removed in the usual fashion.  After our permanent inserts were inserted, we then went through trials again and finally selected a 12.5-mm thickness rotating platform.  We thoroughly water picked out the knee.  I injected some Exparel, a mixture of 20 mL with 20 mL of normal saline.  I used half that mixture.  I inserted some thrombin- soaked Gelfoam  posteriorly.  Once our permanent rotating platform was inserted, we reduced the knee.  We had an excellent stability and excellent motion with a 12.5 mm thickness insert.  I then inserted a Hemovac drain, closed the wound in layers in usual fashion.  Sterile dressings were applied.          ______________________________ Georges Lynch Darrelyn Hillock, M.D.     RAG/MEDQ  D:  12/08/2012  T:  12/09/2012  Job:  [id number]

## 2012-12-09 NOTE — Progress Notes (Signed)
OT Cancellation Note  Patient Details Name: [name] [name] MRN: [id number] DOB: 16-Jul-1951   Cancelled Treatment:    Reason Eval/Treat Not Completed: Other (comment)  Pt was moving slowly with PT this am.  Did better in pm but pt back to bed.  Will see tomorrow.    SPENCER,MARYELLEN 12/09/2012, 2:33 PM Marica Otter, OTR/L [phone] 12/09/2012

## 2012-12-10 DIAGNOSIS — D62 Acute posthemorrhagic anemia: Secondary | ICD-10-CM | POA: Diagnosis not present

## 2012-12-10 LAB — CBC
MCH: 26.6 pg (ref 26.0–34.0)
MCHC: 32.8 g/dL (ref 30.0–36.0)
Platelets: 213 10*3/uL (ref 150–400)

## 2012-12-10 LAB — BASIC METABOLIC PANEL
Calcium: 8.8 mg/dL (ref 8.4–10.5)
GFR calc Af Amer: >90 mL/min (ref >90)
GFR calc non Af Amer: 89 mL/min — ABNORMAL LOW (ref >90)
Sodium: 132 mEq/L — ABNORMAL LOW (ref 135–145)

## 2012-12-10 NOTE — Progress Notes (Signed)
Wide rolling walker has been delivered to the hospital room and wide commode will be delivered to the patients home.

## 2012-12-10 NOTE — Progress Notes (Signed)
Physical Therapy Treatment Patient Details Name: [name] [name] MRN: [id number] DOB: [birth year] Today's Date: 12/10/2012 Time: 7829-5621 PT Time Calculation (min): 10 min  PT Assessment / Plan / Recommendation Comments on Treatment Session  Progressing well. Recommend HHPT.     Follow Up Recommendations  Home health PT     Does the patient have the potential to tolerate intense rehabilitation     Barriers to Discharge        Equipment Recommendations  Rolling walker with 5" wheels    Recommendations for Other Services    Frequency 7X/week   Plan Discharge plan remains appropriate    Precautions / Restrictions Precautions Precautions: Knee Required Braces or Orthoses: Knee Immobilizer - Right Knee Immobilizer - Right: Discontinue once straight leg raise with < 10 degree lag Restrictions Weight Bearing Restrictions: No RLE Weight Bearing: Weight bearing as tolerated   Pertinent Vitals/Pain 7/10 R knee    Mobility  Bed Mobility Bed Mobility: Supine to Sit;Sit to Supine Supine to Sit: 4: Min assist Sit to Supine: 4: Min assist Details for Bed Mobility Assistance: assist for L LE off/onto bed. VCs safety Transfers Transfers: Sit to Stand;Stand to Sit Sit to Stand: 4: Min guard;From bed Stand to Sit: 4: Min guard;To bed Details for Transfer Assistance: VCs safety, technique, hand placement. Assist to rise, stabilize Ambulation/Gait Ambulation Distance (Feet): 95 Feet Assistive device: Rolling walker Ambulation/Gait Assistance Details: VCS safety, step length.  Gait Pattern: Step-to pattern;Trunk flexed;Antalgic    Exercises     PT Diagnosis:    PT Problem List:   PT Treatment Interventions:     PT Goals Acute Rehab PT Goals Pt will go Supine/Side to Sit: with supervision PT Goal: Supine/Side to Sit - Progress: Progressing toward goal Pt will go Sit to Supine/Side: with supervision PT Goal: Sit to Supine/Side - Progress: Progressing toward goal Pt will go Sit to  Stand: with supervision PT Goal: Sit to Stand - Progress: Progressing toward goal Pt will Ambulate: 51 - 150 feet;with supervision;with rolling walker PT Goal: Ambulate - Progress: Progressing toward goal  Visit Information  Last PT Received On: 12/10/12 Assistance Needed: +1    Subjective Data  Subjective: Those pills dont last long Patient Stated Goal: less pain. home   Cognition       Balance     End of Session PT - End of Session Equipment Utilized During Treatment: Right knee immobilizer Activity Tolerance: Patient tolerated treatment well Patient left: in bed;with call bell/phone within reach   GP     Rebeca Alert, MPT Pager: [phone]

## 2012-12-10 NOTE — Progress Notes (Signed)
Physical Therapy Treatment Patient Details Name: [name] [name] MRN: [id number] DOB: May 04, 1951 Today's Date: 12/10/2012 Time: 1012-1036 PT Time Calculation (min): 24 min  PT Assessment / Plan / Recommendation Comments on Treatment Session  Progressing well. Recommend HHPT.     Follow Up Recommendations  Home health PT;Supervision for mobility/OOB     Does the patient have the potential to tolerate intense rehabilitation     Barriers to Discharge        Equipment Recommendations  Rolling walker with 5" wheels    Recommendations for Other Services OT consult  Frequency 7X/week   Plan Discharge plan remains appropriate    Precautions / Restrictions Precautions Precautions: Knee Required Braces or Orthoses: Knee Immobilizer - Right Knee Immobilizer - Right: Discontinue once straight leg raise with < 10 degree lag Restrictions Weight Bearing Restrictions: No RLE Weight Bearing: Weight bearing as tolerated   Pertinent Vitals/Pain 8/10 R knee with activity    Mobility  Bed Mobility Bed Mobility: Not assessed Transfers Transfers: Sit to Stand;Stand to Sit Sit to Stand: 4: Min assist;From chair/3-in-1 Stand to Sit: 4: Min guard;To chair/3-in-1 Details for Transfer Assistance: VCs safety, technique, hand placement. Assist to rise, stabilize Ambulation/Gait Ambulation Distance (Feet): 95 Feet Assistive device: Rolling walker Ambulation/Gait Assistance Details: VCs safety, technique, sequence, step length Gait Pattern: Step-to pattern;Antalgic;Trunk flexed    Exercises Total Joint Exercises Ankle Circles/Pumps: AROM;Both;10 reps;Seated Quad Sets: AROM;Both;10 reps;Seated Short Arc Quad: AAROM;Right;10 reps;Seated Heel Slides: AAROM;Right;10 reps;Seated Hip ABduction/ADduction: AAROM;Right;10 reps;Seated Straight Leg Raises: AAROM;Right;10 reps;Seated   PT Diagnosis:    PT Problem List:   PT Treatment Interventions:     PT Goals Acute Rehab PT Goals Pt will go Sit to  Stand: with supervision PT Goal: Sit to Stand - Progress: Progressing toward goal Pt will Ambulate: 51 - 150 feet;with supervision;with rolling walker PT Goal: Ambulate - Progress: Progressing toward goal Pt will Perform Home Exercise Program: with supervision, verbal cues required/provided PT Goal: Perform Home Exercise Program - Progress: Progressing toward goal  Visit Information  Last PT Received On: 12/10/12 Assistance Needed: +1    Subjective Data  Subjective: Those percocets are good... Patient Stated Goal: less pain. home   Cognition  Cognition Overall Cognitive Status: Appears within functional limits for tasks assessed/performed Arousal/Alertness: Awake/alert Orientation Level: Appears intact for tasks assessed Behavior During Session: [facility] for tasks performed    Balance     End of Session PT - End of Session Equipment Utilized During Treatment: Gait belt;Right knee immobilizer Activity Tolerance: Patient tolerated treatment well Patient left: in chair;with call bell/phone within reach   GP     Rebeca Alert, MPT Pager: [phone]

## 2012-12-10 NOTE — Progress Notes (Signed)
   Subjective: 2 Days Post-Op Procedure(s) (LRB): TOTAL KNEE ARTHROPLASTY (Right) Patient reports pain as moderate.   Patient seen in rounds without Dr. Darrelyn Hillock. Patient is well, and has had no acute complaints or problems. He reports that his pain is better controlled than yesterday and he did get some rest last night. No complaints of chest pain or shortness of breath. He reports that therapy went well, walking to the end of the hall.  Plan is to go Home after hospital stay.  Objective: Vital signs in last 24 hours: Temp:  [98.1 F (36.7 C)-99.9 F (37.7 C)] 98.1 F (36.7 C) (03/07 0546) Pulse Rate:  [71-109] 102 (03/07 0546) Resp:  [16] 16 (03/07 0546) BP: (122-145)/(60-82) 145/82 mmHg (03/07 0546) SpO2:  [95 %-97 %] 96 % (03/07 0546)  Intake/Output from previous day:  Intake/Output Summary (Last 24 hours) at 12/10/12 0747 Last data filed at 12/10/12 0600  Gross per 24 hour  Intake 921.33 ml  Output   1875 ml  Net -953.67 ml    Intake/Output this shift:    Labs:  Recent Labs  12/09/12 0415 12/10/12 0410  HGB 11.5* 10.6*    Recent Labs  12/09/12 0415 12/10/12 0410  WBC 7.1 8.7  RBC 4.18* 3.98*  HCT 34.1* 32.3*  PLT 229 213    Recent Labs  12/09/12 0415 12/10/12 0410  NA 133* 132*  K 4.1 3.7  CL 95* 94*  CO2 29 29  BUN 8 11  CREATININE 0.93 0.93  GLUCOSE 188* 178*  CALCIUM 8.5 8.8    EXAM General - Patient is Alert and Oriented Extremity - Neurologically intact Neurovascular intact Dorsiflexion/Plantar flexion intact No cellulitis present Dressing/Incision - clean, dry, no drainage Motor Function - intact, moving foot and toes well on exam.   Past Medical History  Diagnosis Date  . Diabetes mellitus without complication   . Hypertension   . Arthritis     knees- hx. gout    Assessment/Plan: 2 Days Post-Op Procedure(s) (LRB): TOTAL KNEE ARTHROPLASTY (Right) Active Problems:   Osteoarthritis of left hip S/p right total knee  arthroplasty  Estimated body mass index is 45.17 kg/(m^2) as calculated from the following:   Height as of this encounter: 5\' 8"  (1.727 m).   Weight as of this encounter: 134.718 kg (297 lb). Advance diet Up with therapy Plan for discharge tomorrow with home health  DVT Prophylaxis - Xarelto Weight-Bearing as tolerated to right leg  He is progressing well. Continue therapy today. Recheck dressing and labs in the morning. Discharge home tomorrow with home health.   Belen Pesch LAUREN 12/10/2012, 7:47 AM

## 2012-12-10 NOTE — Evaluation (Signed)
Occupational Therapy Evaluation Patient Details Name: [name] [name] MRN: [id number] DOB: [birth year] Today's Date: 12/10/2012 Time: 6213-0865 OT Time Calculation (min): 21 min  OT Assessment / Plan / Recommendation Clinical Impression   This 62 year old man was admitted for R TKA.  He will benefit from continued OT in acute to review shower transfer.  Pt will not need post acute OT services.      OT Assessment  Patient needs continued OT Services    Follow Up Recommendations  No OT follow up    Barriers to Discharge      Equipment Recommendations  3 in 1 bedside comode (Pt fits on elongated model (297#))    Recommendations for Other Services    Frequency  Min 2X/week    Precautions / Restrictions Precautions Precautions: Knee Knee Immobilizer - Right: Discontinue once straight leg raise with < 10 degree lag Restrictions RLE Weight Bearing: Weight bearing as tolerated   Pertinent Vitals/Pain R knee 8/10 with weightbearing    ADL  Grooming: Set up Where Assessed - Grooming: Unsupported sitting Upper Body Bathing: Set up Where Assessed - Upper Body Bathing: Unsupported sitting Lower Body Bathing: Minimal assistance Where Assessed - Lower Body Bathing: Supported sit to stand Upper Body Dressing: Minimal assistance (iv) Where Assessed - Upper Body Dressing: Unsupported sitting Lower Body Dressing: Moderate assistance Where Assessed - Lower Body Dressing: Supported sit to Pharmacist, hospital: Minimal assistance Statistician Method: Sit to Barista: Raised toilet seat with arms (or 3-in-1 over toilet) Toileting - Clothing Manipulation and Hygiene: Minimal assistance Where Assessed - Engineer, mining and Hygiene: Sit to stand from 3-in-1 or toilet Equipment Used: Rolling walker Transfers/Ambulation Related to ADLs: ambulated to bathroom:  did not perform shower transfer due to pain ADL Comments: wife will assist with adls.      OT  Diagnosis: Generalized weakness  OT Problem List: Decreased strength;Decreased activity tolerance;Pain OT Treatment Interventions: Self-care/ADL training;DME and/or AE instruction;Patient/family education   OT Goals Acute Rehab OT Goals OT Goal Formulation: With patient Time For Goal Achievement: 12/17/12 Potential to Achieve Goals: Good ADL Goals Pt Will Transfer to Toilet: with min assist;Ambulation;3-in-1 (min guard) ADL Goal: Toilet Transfer - Progress: Goal set today Pt Will Perform Tub/Shower Transfer: Shower transfer;with min assist;Ambulation (3:1) ADL Goal: Tub/Shower Transfer - Progress: Goal set today  Visit Information  Last OT Received On: 12/10/12 Assistance Needed: +1    Subjective Data  Subjective: This hurts more than yesterday Patient Stated Goal: home   Prior Functioning     Home Living Lives With: Spouse Bathroom Shower/Tub: Walk-in Stage manager: Standard Additional Comments: someone is delivering a 3:1 for hiim Prior Function Level of Independence: Independent Able to Take Stairs?: Yes Driving: Yes Communication Communication: No difficulties         Vision/Perception     Cognition  Cognition Overall Cognitive Status: Appears within functional limits for tasks assessed/performed Arousal/Alertness: Awake/alert Orientation Level: Appears intact for tasks assessed Behavior During Session: [facility] for tasks performed    Extremity/Trunk Assessment Right Upper Extremity Assessment RUE ROM/Strength/Tone: [facility] for tasks assessed Left Upper Extremity Assessment LUE ROM/Strength/Tone: WFL for tasks assessed     Mobility Transfers Sit to Stand: 4: Min assist;From chair/3-in-1 Details for Transfer Assistance: VCs safety, technique, hand placement. Assist to rise, stabilize     Exercise     Balance     End of Session OT - End of Session Activity Tolerance: Patient tolerated treatment well Patient left:  in chair;with call bell/phone  within reach  GO     [name] [name] 12/10/2012, 10:08 AM Marica Otter, OTR/L [phone] 12/10/2012

## 2012-12-11 LAB — CBC
HCT: 31 % — ABNORMAL LOW (ref 39.0–52.0)
MCHC: 32.6 g/dL (ref 30.0–36.0)
MCV: 82.4 fL (ref 78.0–100.0)
Platelets: 212 10*3/uL (ref 150–400)
RDW: 13.3 % (ref 11.5–15.5)
WBC: 8 10*3/uL (ref 4.0–10.5)

## 2012-12-11 LAB — GLUCOSE, CAPILLARY: Glucose-Capillary: 144 mg/dL — ABNORMAL HIGH (ref 70–99)

## 2012-12-11 NOTE — Progress Notes (Signed)
Physical Therapy Treatment Patient Details Name: [name] [name] MRN: [id number] DOB: [birth year] Today's Date: 12/11/2012 Time: 1002-1026 PT Time Calculation (min): 24 min  PT Assessment / Plan / Recommendation Comments on Treatment Session  ready for DC, instruction completed.    Follow Up Recommendations  Home health PT     Does the patient have the potential to tolerate intense rehabilitation     Barriers to Discharge        Equipment Recommendations  Rolling walker with 5" wheels    Recommendations for Other Services    Frequency     Plan      Precautions / Restrictions Precautions Precautions: Knee Required Braces or Orthoses: Knee Immobilizer - Right Knee Immobilizer - Right: Discontinue once straight leg raise with < 10 degree lag   Pertinent Vitals/Pain     Mobility  Transfers Sit to Stand: 4: Min guard;From bed Stand to Sit: To chair/3-in-1;5: Supervision Details for Transfer Assistance: VCs safety, technique, hand placement. Assist to rise, stabilize Ambulation/Gait Ambulation/Gait Assistance: 4: Min guard Ambulation Distance (Feet): 50 Feet Assistive device: Rolling walker Gait Pattern: Step-to pattern;Trunk flexed;Antalgic Stairs: Yes Stairs Assistance: 4: Min assist Stairs Assistance Details (indicate cue type and reason): instruction on use of RW with wife,  did perform forward but does not have 2 rails. Stair Management Technique: One rail Right;Forwards;With walker Number of Stairs: 2    Exercises Total Joint Exercises Quad Sets: AROM;Both;10 reps;Seated Short Arc Quad: AAROM;Right;10 reps;Seated Heel Slides: AAROM;Right;10 reps;Seated Hip ABduction/ADduction: AAROM;Right;10 reps;Seated Straight Leg Raises: AAROM;Right;10 reps;Seated   PT Diagnosis:    PT Problem List:   PT Treatment Interventions:     PT Goals Acute Rehab PT Goals Pt will go Sit to Stand: with supervision PT Goal: Sit to Stand - Progress: Met Pt will Ambulate: 51 - 150  feet;with supervision;with rolling walker PT Goal: Ambulate - Progress: Progressing toward goal Pt will Go Up / Down Stairs: 3-5 stairs;with rail(s);with least restrictive assistive device;with min assist PT Goal: Up/Down Stairs - Progress: Progressing toward goal Pt will Perform Home Exercise Program: with supervision, verbal cues required/provided PT Goal: Perform Home Exercise Program - Progress: Progressing toward goal  Visit Information  Last PT Received On: 12/11/12 Assistance Needed: +1    Subjective Data  Subjective: It is sore.   Cognition       Balance     End of Session PT - End of Session Equipment Utilized During Treatment: Right knee immobilizer Activity Tolerance: Patient tolerated treatment well Patient left: with call bell/phone within reach;in chair Nurse Communication: Mobility status   GP     Rada Hay 12/11/2012, 4:16 PM

## 2012-12-11 NOTE — Progress Notes (Signed)
OT Cancellation Note and Discharge  Patient Details Name: [name] [name] MRN: [id number] DOB: 11-21-1950   Cancelled Treatment:    Reason Eval/Treat Not Completed: Other (comment)  Pt does not feel he needs to practice shower transfer and feels comfortable with toilet transfer.  Has assist for adls.  Will sign off from OT  [facility] 12/11/2012, 11:38 AM Marica Otter, OTR/L [phone] 12/11/2012

## 2012-12-11 NOTE — Plan of Care (Signed)
Problem: Discharge Progression Outcomes Goal: Anticoagulant follow-up in place Outcome: Not Applicable Date Met:  12/11/12 On xarelto

## 2012-12-11 NOTE — Progress Notes (Signed)
Discharged from floor via wheelchair.  Spouse present with patient. No new assessment changes.

## 2012-12-11 NOTE — Care Management Note (Signed)
Patient to discharge home today with [facility] services. [facility] to deliver large 3 in 1 commode seat to patient's home.  [place], [place] [phone]

## 2012-12-11 NOTE — Progress Notes (Signed)
   Subjective: 3 Days Post-Op Procedure(s) (LRB): TOTAL KNEE ARTHROPLASTY (Right)   Patient reports pain as mild, pain well controlled. No events throughout the night. Ready to be discharged home.  Objective:   VITALS:   Filed Vitals:   12/11/12 0730  BP: 109/70  Pulse: 101  Temp: 97.7 F (36.5 C)   Resp: 16    Neurovascular intact Dorsiflexion/Plantar flexion intact Incision: dressing C/D/I No cellulitis present Compartment soft  LABS  Recent Labs  12/09/12 0415 12/10/12 0410 12/11/12 0430  HGB 11.5* 10.6* 10.1*  HCT 34.1* 32.3* 31.0*  WBC 7.1 8.7 8.0  PLT 229 213 212     Recent Labs  12/09/12 0415 12/10/12 0410  NA 133* 132*  K 4.1 3.7  BUN 8 11  CREATININE 0.93 0.93  GLUCOSE 188* 178*     Assessment/Plan: 3 Days Post-Op Procedure(s) (LRB): TOTAL KNEE ARTHROPLASTY (Right) Up with therapy Discharge home with home health Follow up in 2 weeks at [facility]. Follow up with Dr. Darrelyn Hillock in 2 weeks.  Contact information:  [facility] [address] [place] [address] [phone]        [name] [name]. Babish   PAC  12/11/2012, 12:03 PM

## 2012-12-13 NOTE — Progress Notes (Signed)
Discharge summary sent to payer through [employer]  

## 2012-12-13 NOTE — Discharge Summary (Signed)
Physician Discharge Summary   Patient ID: [name] [name] MRN: [id number] DOB/AGE: 05/29/1951 62 y.o.  Admit date: 12/08/2012 Discharge date: 12/11/2012  Primary Diagnosis: Osteoarthritis, right knee  Admission Diagnoses:  Past Medical History  Diagnosis Date  . Diabetes mellitus without complication   . Hypertension   . Arthritis     knees- hx. gout   Discharge Diagnoses:   Active Problems:   Osteoarthritis of right knee   Postoperative anemia due to acute blood loss S/P right total knee arthroplasty  Estimated body mass index is 45.17 kg/(m^2) as calculated from the following:   Height as of this encounter: 5\' 8"  (1.727 m).   Weight as of this encounter: 134.718 kg (297 lb).  Procedure:  Procedure(s) (LRB): TOTAL KNEE ARTHROPLASTY (Right)   Consults: None  HPI: [name] [name], 62 y.o. male, has a history of pain and functional disability in the right knee due to arthritis and has failed non-surgical conservative treatments for greater than 12 weeks to includeNSAID's and/or analgesics, corticosteriod injections, weight reduction as appropriate and activity modification. Onset of symptoms was gradual, starting >10 years ago with gradually worsening course since that time. The patient noted no past surgery on the right knee(s). Patient currently rates pain in the right knee(s) at 7 out of 10 with activity. Patient has night pain, worsening of pain with activity and weight bearing, pain that interferes with activities of daily living, pain with passive range of motion and crepitus. Patient has evidence of periarticular osteophytes, joint subluxation and joint space narrowing by imaging studies. There is no active infection.  Laboratory Data: Admission on 12/08/2012, Discharged on 12/11/2012  Component Date Value Range Status  . Glucose-Capillary 12/08/2012 129* 70 - 99 mg/dL Final  . Comment 1 78/29/5621 Documented in Chart   Final  . Glucose-Capillary 12/08/2012 204* 70 - 99 mg/dL  Final  . Comment 1 30/86/5784 Documented in Chart   Final  . Comment 2 12/08/2012 Notify RN   Final  . WBC 12/09/2012 7.1  4.0 - 10.5 K/uL Final  . RBC 12/09/2012 4.18* 4.22 - 5.81 MIL/uL Final  . Hemoglobin 12/09/2012 11.5* 13.0 - 17.0 g/dL Final  . HCT 69/62/9528 34.1* 39.0 - 52.0 % Final  . MCV 12/09/2012 81.6  78.0 - 100.0 fL Final  . MCH 12/09/2012 27.5  26.0 - 34.0 pg Final  . MCHC 12/09/2012 33.7  30.0 - 36.0 g/dL Final  . RDW 41/32/4401 13.4  11.5 - 15.5 % Final  . Platelets 12/09/2012 229  150 - 400 K/uL Final  . Sodium 12/09/2012 133* 135 - 145 mEq/L Final  . Potassium 12/09/2012 4.1  3.5 - 5.1 mEq/L Final  . Chloride 12/09/2012 95* 96 - 112 mEq/L Final  . CO2 12/09/2012 29  19 - 32 mEq/L Final  . Glucose, Bld 12/09/2012 188* 70 - 99 mg/dL Final  . BUN 02/72/5366 8  6 - 23 mg/dL Final  . Creatinine, Ser 12/09/2012 0.93  0.50 - 1.35 mg/dL Final  . Calcium 44/12/4740 8.5  8.4 - 10.5 mg/dL Final  . GFR calc non Af Amer 12/09/2012 89* >90 mL/min Final  . GFR calc Af Amer 12/09/2012 >90  >90 mL/min Final   Comment:                                 The eGFR has been calculated  using the CKD EPI equation.                          This calculation has not been                          validated in all clinical                          situations.                          eGFR's persistently                          <90 mL/min signify                          possible Chronic Kidney Disease.  . Glucose-Capillary 12/08/2012 205* 70 - 99 mg/dL Final  . Glucose-Capillary 12/08/2012 146* 70 - 99 mg/dL Final  . Glucose-Capillary 12/09/2012 190* 70 - 99 mg/dL Final  . Glucose-Capillary 12/09/2012 182* 70 - 99 mg/dL Final  . Glucose-Capillary 12/09/2012 188* 70 - 99 mg/dL Final  . WBC 16/07/9603 8.7  4.0 - 10.5 K/uL Final  . RBC 12/10/2012 3.98* 4.22 - 5.81 MIL/uL Final  . Hemoglobin 12/10/2012 10.6* 13.0 - 17.0 g/dL Final  . HCT 54/06/8118 32.3* 39.0 - 52.0 %  Final  . MCV 12/10/2012 81.2  78.0 - 100.0 fL Final  . MCH 12/10/2012 26.6  26.0 - 34.0 pg Final  . MCHC 12/10/2012 32.8  30.0 - 36.0 g/dL Final  . RDW 14/78/2956 13.5  11.5 - 15.5 % Final  . Platelets 12/10/2012 213  150 - 400 K/uL Final  . Sodium 12/10/2012 132* 135 - 145 mEq/L Final  . Potassium 12/10/2012 3.7  3.5 - 5.1 mEq/L Final  . Chloride 12/10/2012 94* 96 - 112 mEq/L Final  . CO2 12/10/2012 29  19 - 32 mEq/L Final  . Glucose, Bld 12/10/2012 178* 70 - 99 mg/dL Final  . BUN 21/30/8657 11  6 - 23 mg/dL Final  . Creatinine, Ser 12/10/2012 0.93  0.50 - 1.35 mg/dL Final  . Calcium 84/69/6295 8.8  8.4 - 10.5 mg/dL Final  . GFR calc non Af Amer 12/10/2012 89* >90 mL/min Final  . GFR calc Af Amer 12/10/2012 >90  >90 mL/min Final   Comment:                                 The eGFR has been calculated                          using the CKD EPI equation.                          This calculation has not been                          validated in all clinical                          situations.  eGFR's persistently                          <90 mL/min signify                          possible Chronic Kidney Disease.  . Glucose-Capillary 12/09/2012 178* 70 - 99 mg/dL Final  . Glucose-Capillary 12/10/2012 179* 70 - 99 mg/dL Final  . Glucose-Capillary 12/10/2012 168* 70 - 99 mg/dL Final  . WBC 21/30/8657 8.0  4.0 - 10.5 K/uL Final  . RBC 12/11/2012 3.76* 4.22 - 5.81 MIL/uL Final  . Hemoglobin 12/11/2012 10.1* 13.0 - 17.0 g/dL Final  . HCT 84/69/6295 31.0* 39.0 - 52.0 % Final  . MCV 12/11/2012 82.4  78.0 - 100.0 fL Final  . MCH 12/11/2012 26.9  26.0 - 34.0 pg Final  . MCHC 12/11/2012 32.6  30.0 - 36.0 g/dL Final  . RDW 28/41/3244 13.3  11.5 - 15.5 % Final  . Platelets 12/11/2012 212  150 - 400 K/uL Final  . Glucose-Capillary 12/10/2012 152* 70 - 99 mg/dL Final  . Glucose-Capillary 12/10/2012 140* 70 - 99 mg/dL Final  . Glucose-Capillary 12/11/2012 144* 70 - 99  mg/dL Final  . Glucose-Capillary 12/11/2012 153* 70 - 99 mg/dL Final  Hospital Outpatient Visit on 12/01/2012  Component Date Value Range Status  . ABO/RH(D) 12/01/2012 O POS   Final  Hospital Outpatient Visit on 12/01/2012  Component Date Value Range Status  . MRSA, PCR 12/01/2012 NEGATIVE  NEGATIVE Final  . Staphylococcus aureus 12/01/2012 NEGATIVE  NEGATIVE Final   Comment:                                 The Xpert SA Assay (FDA                          approved for NASAL specimens                          in patients over 41 years of age),                          is one component of                          a comprehensive surveillance                          program.  Test performance has                          been validated by [employer] for patients greater                          than or equal to 66 year old.                          It is not intended  to diagnose infection nor to                          guide or monitor treatment.  . WBC 12/01/2012 6.3  4.0 - 10.5 K/uL Final  . RBC 12/01/2012 4.96  4.22 - 5.81 MIL/uL Final  . Hemoglobin 12/01/2012 13.4  13.0 - 17.0 g/dL Final  . HCT 16/07/9603 40.2  39.0 - 52.0 % Final  . MCV 12/01/2012 81.0  78.0 - 100.0 fL Final  . MCH 12/01/2012 27.0  26.0 - 34.0 pg Final  . MCHC 12/01/2012 33.3  30.0 - 36.0 g/dL Final  . RDW 54/06/8118 13.3  11.5 - 15.5 % Final  . Platelets 12/01/2012 297  150 - 400 K/uL Final  . aPTT 12/01/2012 31  24 - 37 seconds Final  . Sodium 12/01/2012 139  135 - 145 mEq/L Final  . Potassium 12/01/2012 4.5  3.5 - 5.1 mEq/L Final  . Chloride 12/01/2012 103  96 - 112 mEq/L Final  . CO2 12/01/2012 27  19 - 32 mEq/L Final  . Glucose, Bld 12/01/2012 124* 70 - 99 mg/dL Final  . BUN 14/78/2956 13  6 - 23 mg/dL Final  . Creatinine, Ser 12/01/2012 0.90  0.50 - 1.35 mg/dL Final  . Calcium 21/30/8657 9.3  8.4 - 10.5 mg/dL Final  . Total Protein 12/01/2012 7.1   6.0 - 8.3 g/dL Final  . Albumin 84/69/6295 3.9  3.5 - 5.2 g/dL Final  . AST 28/41/3244 24  0 - 37 U/L Final  . ALT 12/01/2012 12  0 - 53 U/L Final  . Alkaline Phosphatase 12/01/2012 54  39 - 117 U/L Final  . Total Bilirubin 12/01/2012 0.3  0.3 - 1.2 mg/dL Final  . GFR calc non Af Amer 12/01/2012 >90  >90 mL/min Final  . GFR calc Af Amer 12/01/2012 >90  >90 mL/min Final   Comment:                                 The eGFR has been calculated                          using the CKD EPI equation.                          This calculation has not been                          validated in all clinical                          situations.                          eGFR's persistently                          <90 mL/min signify                          possible Chronic Kidney Disease.  Marland Kitchen Prothrombin Time 12/01/2012 12.3  11.6 - 15.2 seconds Final  . INR 12/01/2012 0.92  0.00 - 1.49 Final  . ABO/RH(D) 12/01/2012 O POS   Final  .  Antibody Screen 12/01/2012 NEG   Final  . Sample Expiration 12/01/2012 12/11/2012   Final  . Color, Urine 12/01/2012 YELLOW  YELLOW Final  . APPearance 12/01/2012 CLEAR  CLEAR Final  . Specific Gravity, Urine 12/01/2012 1.014  1.005 - 1.030 Final  . pH 12/01/2012 5.5  5.0 - 8.0 Final  . Glucose, UA 12/01/2012 NEGATIVE  NEGATIVE mg/dL Final  . Hgb urine dipstick 12/01/2012 NEGATIVE  NEGATIVE Final  . Bilirubin Urine 12/01/2012 NEGATIVE  NEGATIVE Final  . Ketones, ur 12/01/2012 NEGATIVE  NEGATIVE mg/dL Final  . Protein, ur 16/07/9603 NEGATIVE  NEGATIVE mg/dL Final  . Urobilinogen, UA 12/01/2012 0.2  0.0 - 1.0 mg/dL Final  . Nitrite 54/06/8118 NEGATIVE  NEGATIVE Final  . Leukocytes, UA 12/01/2012 NEGATIVE  NEGATIVE Final   MICROSCOPIC NOT DONE ON URINES WITH NEGATIVE PROTEIN, BLOOD, LEUKOCYTES, NITRITE, OR GLUCOSE <1000 mg/dL.     X-Rays:Dg Chest 2 View  12/01/2012  *RADIOLOGY REPORT*  Clinical Data: Hypertension.  Diabetes.  Preoperative for right total knee  replacement.  CHEST - 2 VIEW  Comparison: None.  Findings: Cervical and thoracic spondylosis are observed. Cardiac and mediastinal contours appear normal.  The lungs appear clear.  No pleural effusion is identified.  IMPRESSION:  No significant abnormality identified.   Original Report Authenticated By: Gaylyn Rong, M.D.    X-ray Knee Right Ap And Lateral  12/08/2012  *RADIOLOGY REPORT*  Clinical Data: Post right knee arthroplasty  RIGHT KNEE - 1-2 VIEW  Comparison: Portable exam at 1247 hrs without priors for comparison  Findings: Components of right knee prosthesis identified. Mild tilt of tibial component with stem of tibial component oriented slightly laterally. No fracture, dislocation or periprosthetic lucency. Surgical drain and expected postsurgical soft tissue changes identified. Bones appear demineralized.  IMPRESSION: Right knee prosthesis as above. No acute abnormalities.   Original Report Authenticated By: Ulyses Southward, M.D.     EKG: Orders placed during the hospital encounter of 12/01/12  . EKG 12-LEAD  . EKG 12-LEAD     Hospital Course: [name] [name] is a 62 y.o. who was admitted to [facility]. They were brought to the operating room on 12/08/2012 and underwent Procedure(s): TOTAL KNEE ARTHROPLASTY.  Patient tolerated the procedure well and was later transferred to the recovery room and then to the orthopaedic floor for postoperative care.  They were given PO and IV analgesics for pain control following their surgery.  They were given 24 hours of postoperative antibiotics of  Anti-infectives   Start     Dose/Rate Route Frequency Ordered Stop   12/08/12 1500  ceFAZolin (ANCEF) IVPB 1 g/50 mL premix     1 g 100 mL/hr over 30 Minutes Intravenous Every 6 hours 12/08/12 1347 12/08/12 2121   12/08/12 0926  polymyxin B 500,000 Units, bacitracin 50,000 Units in sodium chloride irrigation 0.9 % 500 mL irrigation  Status:  Discontinued       As needed 12/08/12 0927 12/08/12 1155    12/08/12 0815  ceFAZolin (ANCEF) 3 g in dextrose 5 % 50 mL IVPB     3 g 160 mL/hr over 30 Minutes Intravenous On call to O.R. 12/08/12 1478 12/08/12 0856     and started on DVT prophylaxis in the form of Xarelto.   PT and OT were ordered for total joint protocol.  Discharge planning consulted to help with postop disposition and equipment needs.  Patient had a difficult night on the evening of surgery due to pain.  They started to get  up OOB with therapy on day one. Hemovac drain was pulled without difficulty.  Continued to work with therapy into day two.  Dressing was changed on day two and the incision was clean and dry.  By day three, the patient had progressed with therapy and meeting their goals.  Incision was healing well.  Patient was seen in rounds and was ready to go home.   Discharge Medications: Prior to Admission medications   Medication Sig Start Date End Date Taking? Authorizing Provider  ferrous sulfate 325 (65 FE) MG tablet Take 1 tablet (325 mg total) by mouth 3 (three) times daily after meals. 12/09/12   Taffy Delconte Tamala Ser, PA-C  lisinopril-hydrochlorothiazide (PRINZIDE,ZESTORETIC) 20-25 MG per tablet Take 1 tablet by mouth daily before breakfast.   Yes Historical Provider, MD  metFORMIN (GLUCOPHAGE) 850 MG tablet Take 850 mg by mouth 2 (two) times daily with a meal.   Yes Historical Provider, MD  methocarbamol (ROBAXIN) 500 MG tablet Take 1 tablet (500 mg total) by mouth every 6 (six) hours as needed. 12/09/12   Antonino Nienhuis Tamala Ser, PA-C  oxyCODONE-acetaminophen (PERCOCET/ROXICET) 5-325 MG per tablet Take 2 tablets by mouth every 4 (four) hours as needed. 12/09/12   Olive Zmuda Tamala Ser, PA-C  rivaroxaban (XARELTO) 10 MG TABS tablet Take 1 tablet (10 mg total) by mouth daily with breakfast. 12/09/12   Magaret Justo Tamala Ser, PA-C  simvastatin (ZOCOR) 40 MG tablet Take 40 mg by mouth every evening.   Yes Historical Provider, MD    Diet: Diabetic diet Activity:WBAT Follow-up:in  2 weeks Disposition - Home Discharged Condition: fair   Discharge Orders   Future Orders Complete By Expires     Call MD / Call 911  As directed     Comments:      If you experience chest pain or shortness of breath, CALL 911 and be transported to the hospital emergency room.  If you develope a fever above 101 F, pus (white drainage) or increased drainage or redness at the wound, or calf pain, call your surgeon's office.    Change dressing  As directed     Comments:      Change dressing daily with sterile 4 x 4 inch gauze dressing    Constipation Prevention  As directed     Comments:      Drink plenty of fluids.  Prune juice may be helpful.  You may use a stool softener, such as Colace (over the counter) 100 mg twice a day.  Use MiraLax (over the counter) for constipation as needed.    Diet - low sodium heart healthy  As directed     Discharge instructions  As directed     Comments:      Walk with your walker. Weight bearing as instructed. Home Health Agency will follow you at home for your therapy Shower only, no tub bath. Call if any temperatures greater than 101 or any wound complications: [phone] during the day and ask for Dr. Jeannetta Ellis nurse, [facility].    Do not put a pillow under the knee. Place it under the heel.  As directed     Driving restrictions  As directed     Comments:      No driving    Increase activity slowly as tolerated  As directed         Medication List    STOP taking these medications       aspirin EC 81 MG tablet     HYDROcodone-acetaminophen 7.5-325  MG per tablet  Commonly known as:  NORCO     ibuprofen 200 MG tablet  Commonly known as:  ADVIL,MOTRIN     indomethacin 25 MG capsule  Commonly known as:  INDOCIN      TAKE these medications       ferrous sulfate 325 (65 FE) MG tablet  Take 1 tablet (325 mg total) by mouth 3 (three) times daily after meals.     lisinopril-hydrochlorothiazide 20-25 MG per tablet  Commonly known as:   PRINZIDE,ZESTORETIC  Take 1 tablet by mouth daily before breakfast.     metFORMIN 850 MG tablet  Commonly known as:  GLUCOPHAGE  Take 850 mg by mouth 2 (two) times daily with a meal.     methocarbamol 500 MG tablet  Commonly known as:  ROBAXIN  Take 1 tablet (500 mg total) by mouth every 6 (six) hours as needed.     oxyCODONE-acetaminophen 5-325 MG per tablet  Commonly known as:  PERCOCET/ROXICET  Take 2 tablets by mouth every 4 (four) hours as needed.     rivaroxaban 10 MG Tabs tablet  Commonly known as:  XARELTO  Take 1 tablet (10 mg total) by mouth daily with breakfast.     simvastatin 40 MG tablet  Commonly known as:  ZOCOR  Take 40 mg by mouth every evening.           Follow-up Information   Follow up with GIOFFRE,RONALD A, MD. Schedule an appointment as soon as possible for a visit in 2 weeks.   Contact information:   [address] [place] [address] [phone]       Follow up with [facility]. [facility])    Contact information:   [phone]      Signed: Celedonio Savage Barbara Keng LAUREN 12/13/2012, 11:11 AM

## 2014-05-03 ENCOUNTER — Other Ambulatory Visit: Payer: Self-pay | Admitting: Surgical

## 2014-05-11 ENCOUNTER — Encounter (HOSPITAL_COMMUNITY): Payer: Self-pay | Admitting: Pharmacy Technician

## 2014-05-15 ENCOUNTER — Encounter (HOSPITAL_COMMUNITY): Payer: Self-pay

## 2014-05-15 NOTE — Patient Instructions (Addendum)
[name] [name]  05/15/2014                           YOUR PROCEDURE IS SCHEDULED ON:  05/24/14               ENTER THRU [facility] ENTRANCE AND                            FOLLOW  SIGNS TO SHORT STAY CENTER                 ARRIVE AT SHORT STAY AT:  12:00 PM               CALL THIS NUMBER IF ANY PROBLEMS THE DAY OF SURGERY :               [phone]                                REMEMBER:   Do not eat food  AFTER MIDNIGHT              MAY HAVE CLEAR LIQUIDS UNTIL 8:00 AM               CLEAR LIQUID DIET   Foods Allowed                                                                     Foods Excluded  Coffee and tea, regular and decaf                             liquids that you cannot  Plain Jell-O in any flavor                                             see through such as: Fruit ices (not with fruit pulp)                                     milk, soups, orange juice  Iced Popsicles                                    All solid food Carbonated beverages, regular and diet                                    Cranberry, grape and apple juices Sports drinks like Gatorade Lightly seasoned clear broth or consume(fat free) Sugar, honey syrup  _____________________________________________________________________     _____________________________________________________________________                    Take these medicines the morning of surgery with               A SIPS OF  WATER : NONE        Do not wear jewelry, make-up   Do not wear lotions, powders, or perfumes.   Do not shave legs or underarms 12 hrs. before surgery (men may shave face)  Do not bring valuables to the hospital.  Contacts, dentures or bridgework may not be worn into surgery.  Leave suitcase in the car. After surgery it may be brought to your room.  For patients admitted to the hospital more than one night, checkout time is            11:00 AM                                                        The day of discharge.   Patients discharged the day of surgery will not be allowed to drive home.            If going home same day of surgery, must have someone stay with you              FIRST 24 hrs at home and arrange for some one to drive you              home from hospital.   ________________________________________________________________________                              [place]  Before surgery, you can play an important role.  Because skin is not sterile, your skin needs to be as free of germs as possible.  You can reduce the number of germs on your skin by washing with CHG (chlorahexidine gluconate) soap before surgery.  CHG is an antiseptic cleaner which kills germs and bonds with the skin to continue killing germs even after washing. Please DO NOT use if you have an allergy to CHG or antibacterial soaps.  If your skin becomes reddened/irritated stop using the CHG and inform your nurse when you arrive at Short Stay. Do not shave (including legs and underarms) for at least 48 hours prior to the first CHG shower.  You may shave your face. Please follow these instructions carefully:   1.  Shower with CHG Soap the night before surgery and the  morning of Surgery.   2.  If you choose to wash your hair, wash your hair first as usual with your  normal  Shampoo.   3.  After you shampoo, rinse your hair and body thoroughly to remove the  shampoo.                                         4.  Use CHG as you would any other liquid soap.  You can apply chg directly  to the skin and wash . Gently wash with scrungie or clean wascloth    5.  Apply the CHG Soap to your body ONLY FROM THE NECK DOWN.   Do not use on open                           Wound or open sores. Avoid contact with eyes, ears mouth and  genitals (private parts).                        Genitals (private parts) with your normal soap.              6.  Wash thoroughly,  paying special attention to the area where your surgery  will be performed.   7.  Thoroughly rinse your body with warm water from the neck down.   8.  DO NOT shower/wash with your normal soap after using and rinsing off  the CHG Soap .                9.  Pat yourself dry with a clean towel.             10.  Wear clean pajamas.             11.  Place clean sheets on your bed the night of your first shower and do not  sleep with pets.  Day of Surgery : Do not apply any lotions/deodorants the morning of surgery.  Please wear clean clothes to the hospital/surgery center.  FAILURE TO FOLLOW THESE INSTRUCTIONS MAY RESULT IN THE CANCELLATION OF YOUR SURGERY    PATIENT SIGNATURE_________________________________  ______________________________________________________________________     [name] [name]  An incentive spirometer is a tool that can help keep your lungs clear and active. This tool measures how well you are filling your lungs with each breath. Taking long deep breaths may help reverse or decrease the chance of developing breathing (pulmonary) problems (especially infection) following:  A long period of time when you are unable to move or be active. BEFORE THE PROCEDURE   If the spirometer includes an indicator to show your best effort, your nurse or respiratory therapist will set it to a desired goal.  If possible, sit up straight or lean slightly forward. Try not to slouch.  Hold the incentive spirometer in an upright position. INSTRUCTIONS FOR USE  1. Sit on the edge of your bed if possible, or sit up as far as you can in bed or on a chair. 2. Hold the incentive spirometer in an upright position. 3. Breathe out normally. 4. Place the mouthpiece in your mouth and seal your lips tightly around it. 5. Breathe in slowly and as deeply as possible, raising the piston or the ball toward the top of the column. 6. Hold your breath for 3-5 seconds or for as long as  possible. Allow the piston or ball to fall to the bottom of the column. 7. Remove the mouthpiece from your mouth and breathe out normally. 8. Rest for a few seconds and repeat Steps 1 through 7 at least 10 times every 1-2 hours when you are awake. Take your time and take a few normal breaths between deep breaths. 9. The spirometer may include an indicator to show your best effort. Use the indicator as a goal to work toward during each repetition. 10. After each set of 10 deep breaths, practice coughing to be sure your lungs are clear. If you have an incision (the cut made at the time of surgery), support your incision when coughing by placing a pillow or rolled up towels firmly against it. Once you are able to get out of bed, walk around indoors and cough well. You may stop using the incentive spirometer when instructed by your caregiver.  RISKS AND COMPLICATIONS  Take your time so you do not get dizzy or light-headed.  If you are in pain, you may need to take or ask for pain medication before doing incentive spirometry. It is harder to take a deep breath if you are having pain. AFTER USE  Rest and breathe slowly and easily.  It can be helpful to keep track of a log of your progress. Your caregiver can provide you with a simple table to help with this. If you are using the spirometer at home, follow these instructions: [place] IF:   You are having difficultly using the spirometer.  You have trouble using the spirometer as often as instructed.  Your pain medication is not giving enough relief while using the spirometer.  You develop fever of 100.5 F (38.1 C) or higher. SEEK IMMEDIATE MEDICAL CARE IF:   You cough up bloody sputum that had not been present before.  You develop fever of 102 F (38.9 C) or greater.  You develop worsening pain at or near the incision site. MAKE SURE YOU:   Understand these instructions.  Will watch your condition.  Will get help right  away if you are not doing well or get worse. Document Released: 02/02/2007 Document Revised: 12/15/2011 Document Reviewed: 04/05/2007 [facility] Patient Information 2014 [place], [place].   ________________________________________________________________________

## 2014-05-16 ENCOUNTER — Encounter (HOSPITAL_COMMUNITY): Payer: Self-pay

## 2014-05-16 ENCOUNTER — Encounter (INDEPENDENT_AMBULATORY_CARE_PROVIDER_SITE_OTHER): Payer: Self-pay

## 2014-05-16 ENCOUNTER — Encounter (HOSPITAL_COMMUNITY)
Admission: RE | Admit: 2014-05-16 | Discharge: 2014-05-16 | Disposition: A | Discharge status: Home / Self Care | Payer: BC Managed Care – PPO | Source: Ambulatory Visit | Attending: Orthopedic Surgery | Admitting: Orthopedic Surgery

## 2014-05-16 ENCOUNTER — Other Ambulatory Visit: Payer: Self-pay | Admitting: Surgical

## 2014-05-16 ENCOUNTER — Ambulatory Visit (HOSPITAL_COMMUNITY)
Admission: RE | Admit: 2014-05-16 | Discharge: 2014-05-16 | Disposition: A | Discharge status: Home / Self Care | Payer: BC Managed Care – PPO | Source: Ambulatory Visit | Attending: Surgical | Admitting: Surgical

## 2014-05-16 DIAGNOSIS — Z01812 Encounter for preprocedural laboratory examination: Secondary | ICD-10-CM | POA: Insufficient documentation

## 2014-05-16 DIAGNOSIS — I10 Essential (primary) hypertension: Secondary | ICD-10-CM | POA: Diagnosis not present

## 2014-05-16 DIAGNOSIS — Z01818 Encounter for other preprocedural examination: Secondary | ICD-10-CM | POA: Insufficient documentation

## 2014-05-16 DIAGNOSIS — E119 Type 2 diabetes mellitus without complications: Secondary | ICD-10-CM | POA: Diagnosis not present

## 2014-05-16 HISTORY — DX: Psoriasis, unspecified: L40.9

## 2014-05-16 HISTORY — DX: Personal history of other diseases of the musculoskeletal system and connective tissue: Z87.39

## 2014-05-16 HISTORY — DX: Hyperlipidemia, unspecified: E78.5

## 2014-05-16 LAB — APTT: aPTT: 28 seconds (ref 24–37)

## 2014-05-16 LAB — SURGICAL PCR SCREEN
MRSA, PCR: NEGATIVE
Staphylococcus aureus: NEGATIVE

## 2014-05-16 LAB — URINALYSIS, ROUTINE W REFLEX MICROSCOPIC
Bilirubin Urine: NEGATIVE
Glucose, UA: NEGATIVE mg/dL
Hgb urine dipstick: NEGATIVE
Ketones, ur: NEGATIVE mg/dL
Leukocytes, UA: NEGATIVE
Nitrite: NEGATIVE
Protein, ur: NEGATIVE mg/dL
Specific Gravity, Urine: 1.015 (ref 1.005–1.030)
Urobilinogen, UA: 1 mg/dL (ref 0.0–1.0)
pH: 5.5 (ref 5.0–8.0)

## 2014-05-16 LAB — CBC
HCT: 45.6 % (ref 39.0–52.0)
Hemoglobin: 15.7 g/dL (ref 13.0–17.0)
MCH: 30.1 pg (ref 26.0–34.0)
MCHC: 34.4 g/dL (ref 30.0–36.0)
MCV: 87.5 fL (ref 78.0–100.0)
PLATELETS: 255 10*3/uL (ref 150–400)
RBC: 5.21 MIL/uL (ref 4.22–5.81)
RDW: 13.8 % (ref 11.5–15.5)
WBC: 6.9 10*3/uL (ref 4.0–10.5)

## 2014-05-16 LAB — COMPREHENSIVE METABOLIC PANEL
ALT: 7 U/L (ref 0–53)
AST: 18 U/L (ref 0–37)
Albumin: 4.2 g/dL (ref 3.5–5.2)
Alkaline Phosphatase: 65 U/L (ref 39–117)
Anion gap: 11 (ref 5–15)
BUN: 15 mg/dL (ref 6–23)
CO2: 28 mEq/L (ref 19–32)
Calcium: 10.1 mg/dL (ref 8.4–10.5)
Chloride: 99 mEq/L (ref 96–112)
Creatinine, Ser: 0.92 mg/dL (ref 0.50–1.35)
GFR calc Af Amer: >90 mL/min (ref >90)
GFR calc non Af Amer: 89 mL/min — ABNORMAL LOW (ref >90)
Glucose, Bld: 139 mg/dL — ABNORMAL HIGH (ref 70–99)
Potassium: 5.2 mEq/L (ref 3.7–5.3)
Sodium: 138 mEq/L (ref 137–147)
Total Bilirubin: 0.4 mg/dL (ref 0.3–1.2)
Total Protein: 7.4 g/dL (ref 6.0–8.3)

## 2014-05-16 LAB — PROTIME-INR
INR: 0.93 (ref 0.00–1.49)
Prothrombin Time: 12.5 seconds (ref 11.6–15.2)

## 2014-05-16 MED ORDER — DEXAMETHASONE SODIUM PHOSPHATE 10 MG/ML IJ SOLN: 10.0000 mg | Freq: Once | INTRAMUSCULAR | Status: AC

## 2014-05-16 MED ORDER — TRANEXAMIC ACID 100 MG/ML IV SOLN: 1000.0000 mg | INTRAVENOUS | Status: AC

## 2014-05-16 NOTE — H&P (Signed)
TOTAL KNEE ADMISSION H&P  Patient is being admitted for left total knee arthroplasty.  Subjective:  Chief Complaint:left knee pain.  HPI: [name] [name], 63 y.o. male, has a history of pain and functional disability in the left knee due to arthritis and has failed non-surgical conservative treatments for greater than 12 weeks to includeNSAID's and/or analgesics, corticosteriod injections, weight reduction as appropriate and activity modification.  Onset of symptoms was gradual, starting 10 years ago with gradually worsening course since that time. The patient noted no past surgery on the left knee(s).  Patient currently rates pain in the left knee(s) at 7 out of 10 with activity. Patient has night pain, worsening of pain with activity and weight bearing, pain that interferes with activities of daily living, pain with passive range of motion, crepitus and joint swelling.  Patient has evidence of periarticular osteophytes and joint space narrowing by imaging studies. There is no active infection.  Patient Active Problem List   Diagnosis Date Noted  . Postoperative anemia due to acute blood loss 12/10/2012  . Osteoarthritis of left hip 12/08/2012    Osteoarthritis, left knee  Past Medical History  Diagnosis Date  . Diabetes mellitus without complication   . Hypertension   . Arthritis     knees- hx. gout  . Hyperlipidemia   . History of gout   . Psoriasis     "one spot on my rt leg"    Past Surgical History  Procedure Laterality Date  . Total knee arthroplasty Right 12/08/2012    Procedure: TOTAL KNEE ARTHROPLASTY;  Surgeon: Tobi Bastos, MD;  Location: [facility];  Service: Orthopedics;  Laterality: Right;   Current outpatient prescriptions: aspirin 325 MG tablet, Take 325 mg by mouth daily., Disp: , Rfl: ;   lisinopril-hydrochlorothiazide (PRINZIDE,ZESTORETIC) 20-25 MG per tablet, Take 1 tablet by mouth daily before breakfast., Disp: , Rfl: ;   metFORMIN (GLUCOPHAGE) 1000 MG tablet,  Take 1,000 mg by mouth 2 (two) times daily with a meal., Disp: , Rfl:  polyvinyl alcohol (LIQUIFILM TEARS) 1.4 % ophthalmic solution, Place 1 drop into both eyes 3 (three) times daily as needed for dry eyes., Disp: , Rfl: ;   simvastatin (ZOCOR) 40 MG tablet, Take 40 mg by mouth every evening., Disp: , Rfl:    No Known Allergies  History  Substance Use Topics  . Smoking status: Former Smoker    Types: Cigarettes    Quit date: 05/16/1994  . Smokeless tobacco: Former Systems developer    Quit date: 12/01/1988  . Alcohol Use: Yes     Comment: occ.      Review of Systems  Constitutional: Negative.   HENT: Negative.   Eyes: Negative.   Respiratory: Negative.   Cardiovascular: Negative.   Gastrointestinal: Negative.   Genitourinary: Negative.   Musculoskeletal: Positive for joint pain and myalgias. Negative for back pain, falls and neck pain.       Left knee pain  Skin: Negative.   Neurological: Negative.   Endo/Heme/Allergies: Negative.   Psychiatric/Behavioral: Negative.     Objective:  Physical Exam  Constitutional: He is oriented to person, place, and time. He appears well-developed. No distress.  Morbidly obese  HENT:  Head: Normocephalic and atraumatic.  Right Ear: External ear normal.  Left Ear: External ear normal.  Mouth/Throat: Oropharynx is clear and moist.  Eyes: Conjunctivae and EOM are normal.  Neck: Normal range of motion. Neck supple.  Cardiovascular: Normal rate, regular rhythm, normal heart sounds and intact distal pulses.  No murmur heard. Respiratory: Effort normal and breath sounds normal. No respiratory distress. He has no wheezes.  GI: Soft. Bowel sounds are normal. He exhibits no distension. There is no tenderness.  Musculoskeletal:       Right hip: Normal.       Left hip: Normal.       Right knee: Normal.       Left knee: He exhibits decreased range of motion and swelling. He exhibits no effusion and no erythema. Tenderness found. Medial joint line and  lateral joint line tenderness noted.       Right lower leg: He exhibits no tenderness and no swelling.       Left lower leg: He exhibits no tenderness and no swelling.  Neurological: He is alert and oriented to person, place, and time. He has normal strength and normal reflexes. No sensory deficit.  Skin: No rash noted. He is not diaphoretic. No erythema.  Psychiatric: He has a normal mood and affect. His behavior is normal.   Vitals  Weight: 285 lb Height: 68in Body Surface Area: 2.49 m Body Mass Index: 43.33 kg/m Pulse: 72 (Regular)  BP: 124/74 (Sitting, Left Arm, Standard)  Imaging Review Plain radiographs demonstrate severe degenerative joint disease of the left knee(s). The overall alignment ismild varus. The bone quality appears to be good for age and reported activity level.  Assessment/Plan:  End stage arthritis, left knee   The patient history, physical examination, clinical judgment of the provider and imaging studies are consistent with end stage degenerative joint disease of the left knee(s) and total knee arthroplasty is deemed medically necessary. The treatment options including medical management, injection therapy arthroscopy and arthroplasty were discussed at length. The risks and benefits of total knee arthroplasty were presented and reviewed. The risks due to aseptic loosening, infection, stiffness, patella tracking problems, thromboembolic complications and other imponderables were discussed. The patient acknowledged the explanation, agreed to proceed with the plan and consent was signed. Patient is being admitted for inpatient treatment for surgery, pain control, PT, OT, prophylactic antibiotics, VTE prophylaxis, progressive ambulation and ADL's and discharge planning. The patient is planning to be discharged home with home health services    [place], [place]

## 2014-05-16 NOTE — Progress Notes (Signed)
05/16/14 0846  OBSTRUCTIVE SLEEP APNEA  Have you ever been diagnosed with sleep apnea through a sleep study? No  Do you snore loudly (loud enough to be heard through closed doors)?  0  Do you often feel tired, fatigued, or sleepy during the daytime? 1  Has anyone observed you stop breathing during your sleep? 0  Do you have, or are you being treated for high blood pressure? 1  BMI more than 35 kg/m2? 1  Age over 62 years old? 1  Neck circumference greater than 40 cm/16 inches? 1  Gender: 1  Obstructive Sleep Apnea Score 6  Score 4 or greater  Results sent to PCP

## 2014-05-23 MED ORDER — DEXTROSE 5 % IV SOLN
3.0000 g | INTRAVENOUS | Status: AC
  Administered 2014-05-24: 3 g via INTRAVENOUS
  Filled 2014-05-23: qty 3000

## 2014-05-24 ENCOUNTER — Inpatient Hospital Stay (HOSPITAL_COMMUNITY)
Admission: RE | Admit: 2014-05-24 | Discharge: 2014-05-26 | DRG: 470 | Disposition: A | Discharge status: Home Health | Payer: BC Managed Care – PPO | Source: Ambulatory Visit | Attending: Orthopedic Surgery | Admitting: Orthopedic Surgery

## 2014-05-24 ENCOUNTER — Encounter (HOSPITAL_COMMUNITY): Payer: Self-pay | Admitting: *Deleted

## 2014-05-24 ENCOUNTER — Inpatient Hospital Stay (HOSPITAL_COMMUNITY): Payer: BC Managed Care – PPO | Admitting: Anesthesiology

## 2014-05-24 ENCOUNTER — Encounter (HOSPITAL_COMMUNITY): Payer: BC Managed Care – PPO | Admitting: Anesthesiology

## 2014-05-24 ENCOUNTER — Encounter (HOSPITAL_COMMUNITY)
Admission: RE | Disposition: A | Discharge status: Home Health | Payer: Self-pay | Source: Ambulatory Visit | Attending: Orthopedic Surgery

## 2014-05-24 DIAGNOSIS — Z7982 Long term (current) use of aspirin: Secondary | ICD-10-CM

## 2014-05-24 DIAGNOSIS — M25569 Pain in unspecified knee: Secondary | ICD-10-CM | POA: Diagnosis present

## 2014-05-24 DIAGNOSIS — M1710 Unilateral primary osteoarthritis, unspecified knee: Principal | ICD-10-CM | POA: Diagnosis present

## 2014-05-24 DIAGNOSIS — Z87891 Personal history of nicotine dependence: Secondary | ICD-10-CM | POA: Diagnosis not present

## 2014-05-24 DIAGNOSIS — Z6841 Body Mass Index (BMI) 40.0 and over, adult: Secondary | ICD-10-CM

## 2014-05-24 DIAGNOSIS — M24569 Contracture, unspecified knee: Secondary | ICD-10-CM | POA: Diagnosis present

## 2014-05-24 DIAGNOSIS — I10 Essential (primary) hypertension: Secondary | ICD-10-CM | POA: Diagnosis present

## 2014-05-24 DIAGNOSIS — E119 Type 2 diabetes mellitus without complications: Secondary | ICD-10-CM | POA: Diagnosis present

## 2014-05-24 DIAGNOSIS — E785 Hyperlipidemia, unspecified: Secondary | ICD-10-CM | POA: Diagnosis present

## 2014-05-24 DIAGNOSIS — IMO0002 Reserved for concepts with insufficient information to code with codable children: Secondary | ICD-10-CM | POA: Diagnosis present

## 2014-05-24 DIAGNOSIS — Z96652 Presence of left artificial knee joint: Secondary | ICD-10-CM

## 2014-05-24 DIAGNOSIS — M1712 Unilateral primary osteoarthritis, left knee: Secondary | ICD-10-CM | POA: Diagnosis present

## 2014-05-24 DIAGNOSIS — Z96659 Presence of unspecified artificial knee joint: Secondary | ICD-10-CM

## 2014-05-24 HISTORY — PX: TOTAL KNEE ARTHROPLASTY: SHX125

## 2014-05-24 LAB — TYPE AND SCREEN
ABO/RH(D): O POS
Antibody Screen: NEGATIVE

## 2014-05-24 LAB — GLUCOSE, CAPILLARY
GLUCOSE-CAPILLARY: 168 mg/dL — AB (ref 70–99)
GLUCOSE-CAPILLARY: 182 mg/dL — AB (ref 70–99)
Glucose-Capillary: 111 mg/dL — ABNORMAL HIGH (ref 70–99)

## 2014-05-24 SURGERY — ARTHROPLASTY, KNEE, TOTAL
Anesthesia: General | Site: Knee | Laterality: Left

## 2014-05-24 MED ORDER — FENTANYL CITRATE 0.05 MG/ML IJ SOLN
INTRAMUSCULAR | Status: DC | PRN
  Administered 2014-05-24: 25 ug via INTRAVENOUS
  Administered 2014-05-24: 50 ug via INTRAVENOUS
  Administered 2014-05-24 (×2): 100 ug via INTRAVENOUS
  Administered 2014-05-24: 50 ug via INTRAVENOUS
  Administered 2014-05-24: 25 ug via INTRAVENOUS

## 2014-05-24 MED ORDER — MENTHOL 3 MG MT LOZG: 1.0000 | LOZENGE | OROMUCOSAL | Status: DC | PRN

## 2014-05-24 MED ORDER — PHENOL 1.4 % MT LIQD: 1.0000 | OROMUCOSAL | Status: DC | PRN

## 2014-05-24 MED ORDER — HYDROMORPHONE HCL PF 1 MG/ML IJ SOLN
INTRAMUSCULAR | Status: AC
  Filled 2014-05-24: qty 1

## 2014-05-24 MED ORDER — ONDANSETRON HCL 4 MG/2ML IJ SOLN
INTRAMUSCULAR | Status: DC | PRN
  Administered 2014-05-24: 4 mg via INTRAVENOUS

## 2014-05-24 MED ORDER — KETAMINE HCL 10 MG/ML IJ SOLN
INTRAMUSCULAR | Status: DC | PRN
  Administered 2014-05-24 (×2): 60 mg via INTRAVENOUS

## 2014-05-24 MED ORDER — HYDROMORPHONE HCL PF 2 MG/ML IJ SOLN
INTRAMUSCULAR | Status: AC
  Filled 2014-05-24: qty 1

## 2014-05-24 MED ORDER — ACETAMINOPHEN 325 MG PO TABS: 650.0000 mg | ORAL_TABLET | Freq: Four times a day (QID) | ORAL | Status: DC | PRN

## 2014-05-24 MED ORDER — DOCUSATE SODIUM 100 MG PO CAPS
100.0000 mg | ORAL_CAPSULE | Freq: Two times a day (BID) | ORAL | Status: DC
  Administered 2014-05-24 – 2014-05-26 (×4): 100 mg via ORAL

## 2014-05-24 MED ORDER — SODIUM CHLORIDE 0.9 % IR SOLN
Status: DC | PRN
  Administered 2014-05-24: 15:00:00

## 2014-05-24 MED ORDER — ALUM & MAG HYDROXIDE-SIMETH 200-200-20 MG/5ML PO SUSP: 30.0000 mL | ORAL | Status: DC | PRN

## 2014-05-24 MED ORDER — HYDROCODONE-ACETAMINOPHEN 5-325 MG PO TABS: 1.0000 | ORAL_TABLET | ORAL | Status: DC | PRN

## 2014-05-24 MED ORDER — SUCCINYLCHOLINE CHLORIDE 20 MG/ML IJ SOLN
INTRAMUSCULAR | Status: DC | PRN
  Administered 2014-05-24: 160 mg via INTRAVENOUS

## 2014-05-24 MED ORDER — MIDAZOLAM HCL 5 MG/5ML IJ SOLN
INTRAMUSCULAR | Status: DC | PRN
  Administered 2014-05-24: 2 mg via INTRAVENOUS

## 2014-05-24 MED ORDER — FLEET ENEMA 7-19 GM/118ML RE ENEM: 1.0000 | ENEMA | Freq: Once | RECTAL | Status: AC | PRN

## 2014-05-24 MED ORDER — LIDOCAINE HCL (CARDIAC) 20 MG/ML IV SOLN
INTRAVENOUS | Status: DC | PRN
  Administered 2014-05-24: 100 mg via INTRAVENOUS

## 2014-05-24 MED ORDER — ACETAMINOPHEN 650 MG RE SUPP: 650.0000 mg | Freq: Four times a day (QID) | RECTAL | Status: DC | PRN

## 2014-05-24 MED ORDER — SODIUM CHLORIDE 0.9 % IR SOLN
Status: DC | PRN
  Administered 2014-05-24: 1000 mL

## 2014-05-24 MED ORDER — BISACODYL 5 MG PO TBEC: 5.0000 mg | DELAYED_RELEASE_TABLET | Freq: Every day | ORAL | Status: DC | PRN

## 2014-05-24 MED ORDER — DEXAMETHASONE SODIUM PHOSPHATE 10 MG/ML IJ SOLN
INTRAMUSCULAR | Status: DC | PRN
  Administered 2014-05-24: 10 mg via INTRAVENOUS

## 2014-05-24 MED ORDER — CHLORHEXIDINE GLUCONATE 4 % EX LIQD: 60.0000 mL | Freq: Once | CUTANEOUS | Status: DC

## 2014-05-24 MED ORDER — ONDANSETRON HCL 4 MG/2ML IJ SOLN: 4.0000 mg | Freq: Four times a day (QID) | INTRAMUSCULAR | Status: DC | PRN

## 2014-05-24 MED ORDER — METHOCARBAMOL 1000 MG/10ML IJ SOLN
500.0000 mg | Freq: Four times a day (QID) | INTRAVENOUS | Status: DC | PRN
  Administered 2014-05-24: 500 mg via INTRAVENOUS
  Filled 2014-05-24: qty 5

## 2014-05-24 MED ORDER — PROPOFOL 10 MG/ML IV BOLUS
INTRAVENOUS | Status: DC | PRN
  Administered 2014-05-24: 300 mg via INTRAVENOUS

## 2014-05-24 MED ORDER — RIVAROXABAN 10 MG PO TABS
10.0000 mg | ORAL_TABLET | Freq: Every day | ORAL | Status: DC
  Administered 2014-05-25 – 2014-05-26 (×2): 10 mg via ORAL
  Filled 2014-05-24 (×3): qty 1

## 2014-05-24 MED ORDER — HYDROMORPHONE HCL PF 1 MG/ML IJ SOLN
0.5000 mg | INTRAMUSCULAR | Status: DC | PRN
  Administered 2014-05-24 (×2): 0.5 mg via INTRAVENOUS

## 2014-05-24 MED ORDER — DEXAMETHASONE SODIUM PHOSPHATE 10 MG/ML IJ SOLN
INTRAMUSCULAR | Status: AC
  Filled 2014-05-24: qty 1

## 2014-05-24 MED ORDER — ACETAMINOPHEN 10 MG/ML IV SOLN
1000.0000 mg | Freq: Once | INTRAVENOUS | Status: AC
  Administered 2014-05-24: 1000 mg via INTRAVENOUS
  Filled 2014-05-24: qty 100

## 2014-05-24 MED ORDER — OXYCODONE-ACETAMINOPHEN 5-325 MG PO TABS
2.0000 | ORAL_TABLET | ORAL | Status: DC | PRN
  Administered 2014-05-24 – 2014-05-26 (×10): 2 via ORAL
  Filled 2014-05-24 (×10): qty 2

## 2014-05-24 MED ORDER — BUPIVACAINE LIPOSOME 1.3 % IJ SUSP
20.0000 mL | Freq: Once | INTRAMUSCULAR | Status: AC
  Administered 2014-05-24: 20 mL
  Filled 2014-05-24: qty 20

## 2014-05-24 MED ORDER — PROPOFOL 10 MG/ML IV BOLUS
INTRAVENOUS | Status: AC
  Filled 2014-05-24: qty 20

## 2014-05-24 MED ORDER — POLYVINYL ALCOHOL 1.4 % OP SOLN
1.0000 [drp] | Freq: Three times a day (TID) | OPHTHALMIC | Status: DC | PRN
  Filled 2014-05-24: qty 15

## 2014-05-24 MED ORDER — HYDROMORPHONE HCL PF 1 MG/ML IJ SOLN
1.0000 mg | INTRAMUSCULAR | Status: DC | PRN
  Administered 2014-05-24 – 2014-05-26 (×5): 1 mg via INTRAVENOUS
  Filled 2014-05-24 (×5): qty 1

## 2014-05-24 MED ORDER — SODIUM CHLORIDE 0.9 % IJ SOLN
INTRAMUSCULAR | Status: DC | PRN
  Administered 2014-05-24: 20 mL via INTRAVENOUS

## 2014-05-24 MED ORDER — HYDROMORPHONE HCL PF 1 MG/ML IJ SOLN
0.2500 mg | INTRAMUSCULAR | Status: DC | PRN
Start: 2014-05-24 — End: 2014-05-24
  Administered 2014-05-24 (×4): 0.5 mg via INTRAVENOUS

## 2014-05-24 MED ORDER — LISINOPRIL 20 MG PO TABS
20.0000 mg | ORAL_TABLET | Freq: Every day | ORAL | Status: DC
  Administered 2014-05-25 – 2014-05-26 (×2): 20 mg via ORAL
  Filled 2014-05-24 (×2): qty 1

## 2014-05-24 MED ORDER — INSULIN ASPART 100 UNIT/ML ~~LOC~~ SOLN
0.0000 [IU] | Freq: Three times a day (TID) | SUBCUTANEOUS | Status: DC
  Administered 2014-05-25 (×3): 2 [IU] via SUBCUTANEOUS

## 2014-05-24 MED ORDER — CEFAZOLIN SODIUM 1-5 GM-% IV SOLN
1.0000 g | Freq: Four times a day (QID) | INTRAVENOUS | Status: AC
  Administered 2014-05-24 – 2014-05-25 (×2): 1 g via INTRAVENOUS
  Filled 2014-05-24 (×2): qty 50

## 2014-05-24 MED ORDER — OXYCODONE-ACETAMINOPHEN 5-325 MG PO TABS: 2.0000 | ORAL_TABLET | ORAL | Status: DC | PRN

## 2014-05-24 MED ORDER — LISINOPRIL-HYDROCHLOROTHIAZIDE 20-25 MG PO TABS: 1.0000 | ORAL_TABLET | Freq: Every day | ORAL | Status: DC

## 2014-05-24 MED ORDER — TRANEXAMIC ACID 100 MG/ML IV SOLN
1000.0000 mg | INTRAVENOUS | Status: AC
  Administered 2014-05-24: 1000 mg via INTRAVENOUS
  Filled 2014-05-24: qty 10

## 2014-05-24 MED ORDER — PROMETHAZINE HCL 25 MG/ML IJ SOLN: 6.2500 mg | INTRAMUSCULAR | Status: DC | PRN

## 2014-05-24 MED ORDER — SIMVASTATIN 40 MG PO TABS
40.0000 mg | ORAL_TABLET | Freq: Every evening | ORAL | Status: DC
  Administered 2014-05-24 – 2014-05-25 (×2): 40 mg via ORAL
  Filled 2014-05-24 (×3): qty 1

## 2014-05-24 MED ORDER — METFORMIN HCL 500 MG PO TABS
1000.0000 mg | ORAL_TABLET | Freq: Two times a day (BID) | ORAL | Status: DC
  Administered 2014-05-25 – 2014-05-26 (×3): 1000 mg via ORAL
  Filled 2014-05-24 (×5): qty 2

## 2014-05-24 MED ORDER — METHOCARBAMOL 500 MG PO TABS
500.0000 mg | ORAL_TABLET | Freq: Four times a day (QID) | ORAL | Status: DC | PRN
  Administered 2014-05-25 – 2014-05-26 (×3): 500 mg via ORAL
  Filled 2014-05-24 (×3): qty 1

## 2014-05-24 MED ORDER — ONDANSETRON HCL 4 MG PO TABS: 4.0000 mg | ORAL_TABLET | Freq: Four times a day (QID) | ORAL | Status: DC | PRN

## 2014-05-24 MED ORDER — KETAMINE HCL 10 MG/ML IJ SOLN
INTRAMUSCULAR | Status: AC
  Filled 2014-05-24: qty 1

## 2014-05-24 MED ORDER — ONDANSETRON HCL 4 MG/2ML IJ SOLN
INTRAMUSCULAR | Status: AC
  Filled 2014-05-24: qty 2

## 2014-05-24 MED ORDER — CELECOXIB 200 MG PO CAPS
200.0000 mg | ORAL_CAPSULE | Freq: Two times a day (BID) | ORAL | Status: DC
  Administered 2014-05-25 – 2014-05-26 (×3): 200 mg via ORAL
  Filled 2014-05-24 (×4): qty 1

## 2014-05-24 MED ORDER — POLYETHYLENE GLYCOL 3350 17 G PO PACK: 17.0000 g | PACK | Freq: Every day | ORAL | Status: DC | PRN

## 2014-05-24 MED ORDER — THROMBIN 5000 UNITS EX SOLR
CUTANEOUS | Status: DC | PRN
  Administered 2014-05-24: 5000 [IU] via TOPICAL

## 2014-05-24 MED ORDER — LACTATED RINGERS IV SOLN
INTRAVENOUS | Status: DC
  Administered 2014-05-24: 17:00:00 via INTRAVENOUS

## 2014-05-24 MED ORDER — FENTANYL CITRATE 0.05 MG/ML IJ SOLN
INTRAMUSCULAR | Status: AC
  Filled 2014-05-24: qty 2

## 2014-05-24 MED ORDER — THROMBIN 5000 UNITS EX SOLR
CUTANEOUS | Status: AC
  Filled 2014-05-24: qty 10000

## 2014-05-24 MED ORDER — MIDAZOLAM HCL 2 MG/2ML IJ SOLN
INTRAMUSCULAR | Status: AC
  Filled 2014-05-24: qty 2

## 2014-05-24 MED ORDER — LACTATED RINGERS IV SOLN
INTRAVENOUS | Status: DC
  Administered 2014-05-24: 1000 mL via INTRAVENOUS
  Administered 2014-05-24: 14:00:00 via INTRAVENOUS

## 2014-05-24 MED ORDER — SODIUM CHLORIDE 0.9 % IJ SOLN
INTRAMUSCULAR | Status: AC
  Filled 2014-05-24: qty 50

## 2014-05-24 MED ORDER — FENTANYL CITRATE 0.05 MG/ML IJ SOLN
INTRAMUSCULAR | Status: AC
  Filled 2014-05-24: qty 5

## 2014-05-24 MED ORDER — HYDROCHLOROTHIAZIDE 25 MG PO TABS
25.0000 mg | ORAL_TABLET | Freq: Every day | ORAL | Status: DC
Start: 2014-05-25 — End: 2014-05-26
  Administered 2014-05-25 – 2014-05-26 (×2): 25 mg via ORAL
  Filled 2014-05-24 (×2): qty 1

## 2014-05-24 MED ORDER — METOCLOPRAMIDE HCL 5 MG/ML IJ SOLN
INTRAMUSCULAR | Status: DC | PRN
  Administered 2014-05-24: 10 mg via INTRAVENOUS

## 2014-05-24 MED ORDER — FERROUS SULFATE 325 (65 FE) MG PO TABS
325.0000 mg | ORAL_TABLET | Freq: Three times a day (TID) | ORAL | Status: DC
  Administered 2014-05-25 – 2014-05-26 (×4): 325 mg via ORAL
  Filled 2014-05-24 (×7): qty 1

## 2014-05-24 MED ORDER — LIDOCAINE HCL (CARDIAC) 20 MG/ML IV SOLN
INTRAVENOUS | Status: AC
  Filled 2014-05-24: qty 5

## 2014-05-24 MED ORDER — HYDROMORPHONE HCL PF 1 MG/ML IJ SOLN
INTRAMUSCULAR | Status: DC | PRN
  Administered 2014-05-24 (×2): 1 mg via INTRAVENOUS

## 2014-05-24 SURGICAL SUPPLY — 68 items
BAG ZIPLOCK 12X15 (MISCELLANEOUS) IMPLANT
BANDAGE ELASTIC 4 VELCRO ST LF (GAUZE/BANDAGES/DRESSINGS) ×3 IMPLANT
BANDAGE ELASTIC 6 VELCRO ST LF (GAUZE/BANDAGES/DRESSINGS) ×3 IMPLANT
BANDAGE ESMARK 6X9 LF (GAUZE/BANDAGES/DRESSINGS) ×1 IMPLANT
BLADE SAG 18X100X1.27 (BLADE) ×3 IMPLANT
BLADE SAW SGTL 11.0X1.19X90.0M (BLADE) ×3 IMPLANT
BNDG ESMARK 6X9 LF (GAUZE/BANDAGES/DRESSINGS) ×3
BONE CEMENT GENTAMICIN (Cement) ×6 IMPLANT
CAP UPCHARGE REVISION TRAY ×3 IMPLANT
CAPT RP KNEE ×3 IMPLANT
CEMENT BONE GENTAMICIN 40 (Cement) ×2 IMPLANT
CUFF TOURN SGL QUICK 34 (TOURNIQUET CUFF) ×2
CUFF TRNQT CYL 34X4X40X1 (TOURNIQUET CUFF) ×1 IMPLANT
DERMABOND ADVANCED (GAUZE/BANDAGES/DRESSINGS) ×2
DERMABOND ADVANCED .7 DNX12 (GAUZE/BANDAGES/DRESSINGS) ×1 IMPLANT
DRAPE EXTREMITY T 121X128X90 (DRAPE) ×3 IMPLANT
DRAPE INCISE IOBAN 66X45 STRL (DRAPES) ×3 IMPLANT
DRAPE LG THREE QUARTER DISP (DRAPES) ×3 IMPLANT
DRAPE POUCH INSTRU U-SHP 10X18 (DRAPES) ×3 IMPLANT
DRAPE U-SHAPE 47X51 STRL (DRAPES) ×3 IMPLANT
DRSG AQUACEL AG ADV 3.5X10 (GAUZE/BANDAGES/DRESSINGS) ×3 IMPLANT
DRSG PAD ABDOMINAL 8X10 ST (GAUZE/BANDAGES/DRESSINGS) IMPLANT
DRSG TEGADERM 4X4.75 (GAUZE/BANDAGES/DRESSINGS) IMPLANT
DURAPREP 26ML APPLICATOR (WOUND CARE) ×3 IMPLANT
ELECT REM PT RETURN 9FT ADLT (ELECTROSURGICAL) ×3
ELECTRODE REM PT RTRN 9FT ADLT (ELECTROSURGICAL) ×1 IMPLANT
EVACUATOR 1/8 PVC DRAIN (DRAIN) ×3 IMPLANT
FACESHIELD WRAPAROUND (MASK) ×15 IMPLANT
GAUZE SPONGE 2X2 8PLY STRL LF (GAUZE/BANDAGES/DRESSINGS) ×1 IMPLANT
GLOVE BIOGEL PI IND STRL 6.5 (GLOVE) ×1 IMPLANT
GLOVE BIOGEL PI IND STRL 8 (GLOVE) ×1 IMPLANT
GLOVE BIOGEL PI INDICATOR 6.5 (GLOVE) ×2
GLOVE BIOGEL PI INDICATOR 8 (GLOVE) ×2
GLOVE ECLIPSE 8.0 STRL XLNG CF (GLOVE) ×6 IMPLANT
GLOVE SURG SS PI 6.5 STRL IVOR (GLOVE) ×3 IMPLANT
GOWN STRL REUS W/TWL LRG LVL3 (GOWN DISPOSABLE) ×3 IMPLANT
GOWN STRL REUS W/TWL XL LVL3 (GOWN DISPOSABLE) ×3 IMPLANT
HANDPIECE INTERPULSE COAX TIP (DISPOSABLE) ×2
IMMOBILIZER KNEE 20 (SOFTGOODS) ×6 IMPLANT
IMMOBILIZER KNEE 20 THIGH 36 (SOFTGOODS) ×1 IMPLANT
KIT BASIN OR (CUSTOM PROCEDURE TRAY) ×3 IMPLANT
MANIFOLD NEPTUNE II (INSTRUMENTS) ×3 IMPLANT
NEEDLE HYPO 22GX1.5 SAFETY (NEEDLE) ×3 IMPLANT
NS IRRIG 1000ML POUR BTL (IV SOLUTION) IMPLANT
PACK TOTAL JOINT (CUSTOM PROCEDURE TRAY) ×3 IMPLANT
PADDING CAST COTTON 6X4 STRL (CAST SUPPLIES) IMPLANT
POSITIONER SURGICAL ARM (MISCELLANEOUS) ×3 IMPLANT
SET HNDPC FAN SPRY TIP SCT (DISPOSABLE) ×1 IMPLANT
SET PAD KNEE POSITIONER (MISCELLANEOUS) ×3 IMPLANT
SPONGE GAUZE 2X2 STER 10/PKG (GAUZE/BANDAGES/DRESSINGS) ×2
SPONGE LAP 18X18 X RAY DECT (DISPOSABLE) ×3 IMPLANT
SPONGE SURGIFOAM ABS GEL 100 (HEMOSTASIS) ×3 IMPLANT
STAPLER VISISTAT 35W (STAPLE) IMPLANT
SUCTION FRAZIER 12FR DISP (SUCTIONS) ×3 IMPLANT
SUT BONE WAX W31G (SUTURE) ×3 IMPLANT
SUT MNCRL AB 4-0 PS2 18 (SUTURE) ×3 IMPLANT
SUT VIC AB 1 CT1 27 (SUTURE) ×6
SUT VIC AB 1 CT1 27XBRD ANTBC (SUTURE) ×3 IMPLANT
SUT VIC AB 2-0 CT1 27 (SUTURE) ×8
SUT VIC AB 2-0 CT1 TAPERPNT 27 (SUTURE) ×4 IMPLANT
SUT VLOC 180 0 24IN GS25 (SUTURE) ×3 IMPLANT
SYRINGE 20CC LL (MISCELLANEOUS) ×3 IMPLANT
TOWEL OR 17X26 10 PK STRL BLUE (TOWEL DISPOSABLE) ×3 IMPLANT
TOWEL OR NON WOVEN STRL DISP B (DISPOSABLE) ×3 IMPLANT
TOWER CARTRIDGE SMART MIX (DISPOSABLE) ×3 IMPLANT
TRAY FOLEY CATH 14FRSI W/METER (CATHETERS) ×3 IMPLANT
WATER STERILE IRR 1500ML POUR (IV SOLUTION) ×3 IMPLANT
WRAP KNEE MAXI GEL POST OP (GAUZE/BANDAGES/DRESSINGS) ×3 IMPLANT

## 2014-05-24 NOTE — Anesthesia Postprocedure Evaluation (Signed)
  Anesthesia Post-op Note  Patient: [name] [name]  Procedure(s) Performed: Procedure(s) (LRB): LEFT TOTAL KNEE ARTHROPLASTY (Left)  Patient Location: PACU  Anesthesia Type: General  Level of Consciousness: awake and alert   Airway and Oxygen Therapy: Patient Spontanous Breathing  Post-op Pain: mild  Post-op Assessment: Post-op Vital signs reviewed, Patient's Cardiovascular Status Stable, Respiratory Function Stable, Patent Airway and No signs of Nausea or vomiting  Last Vitals:  Filed Vitals:   05/24/14 1819  BP: 122/81  Pulse: 114  Temp: 37.2 C  Resp: 16    Post-op Vital Signs: stable   Complications: No apparent anesthesia complications

## 2014-05-24 NOTE — Anesthesia Preprocedure Evaluation (Addendum)
Anesthesia Evaluation  Patient identified by MRN, date of birth, ID band Patient awake    Reviewed: Allergy & Precautions, H&P , NPO status , Patient's Chart, lab work & pertinent test results  Airway Mallampati: III TM Distance: >3 FB Neck ROM: Full    Dental  (+) Teeth Intact, Dental Advisory Given   Pulmonary former smoker,  breath sounds clear to auscultation        Cardiovascular hypertension, Pt. on medications Rhythm:Regular Rate:Normal     Neuro/Psych negative neurological ROS  negative psych ROS   GI/Hepatic negative GI ROS, Neg liver ROS,   Endo/Other  diabetes, Type 2, Oral Hypoglycemic AgentsMorbid obesity  Renal/GU negative Renal ROS     Musculoskeletal  (+) Arthritis -, Osteoarthritis,    Abdominal   Peds  Hematology negative hematology ROS (+)   Anesthesia Other Findings   Reproductive/Obstetrics negative OB ROS                          Anesthesia Physical Anesthesia Plan  ASA: III  Anesthesia Plan: General   Post-op Pain Management:    Induction: Intravenous  Airway Management Planned: Oral ETT  Additional Equipment: None  Intra-op Plan:   Post-operative Plan: Extubation in OR  Informed Consent: I have reviewed the patients History and Physical, chart, labs and discussed the procedure including the risks, benefits and alternatives for the proposed anesthesia with the patient or authorized representative who has indicated his/her understanding and acceptance.   Dental advisory given  Plan Discussed with: CRNA  Anesthesia Plan Comments:         Anesthesia Quick Evaluation

## 2014-05-24 NOTE — Transfer of Care (Signed)
Immediate Anesthesia Transfer of Care Note  Patient: [name] [name]  Procedure(s) Performed: Procedure(s): LEFT TOTAL KNEE ARTHROPLASTY (Left)  Patient Location: PACU  Anesthesia Type:General  Level of Consciousness: Patient easily awoken, sedated, comfortable, cooperative, following commands, responds to stimulation.   Airway & Oxygen Therapy: Patient spontaneously breathing, ventilating well, oxygen via simple oxygen mask.  Post-op Assessment: Report given to PACU RN, vital signs reviewed and stable, moving all extremities.   Post vital signs: Reviewed and stable.  Complications: No apparent anesthesia complications

## 2014-05-24 NOTE — Op Note (Signed)
NAMECARRSON, LIGHTCAP NO.:  [id number]  MEDICAL RECORD NO.:  [id number]  LOCATION:  63                         FACILITY:  [facility]  PHYSICIAN:  Kipp Brood. Kayzlee Wirtanen, M.D.DATE OF BIRTH:  08-10-1951  DATE OF PROCEDURE:  05/24/2014 DATE OF DISCHARGE:                              OPERATIVE REPORT   PREOPERATIVE DIAGNOSES: 1. Severe osteoarthritis of the left knee with bone on bone. 2. He also had a flexion contracture of the left knee.  POSTOPERATIVE DIAGNOSES: 1. Severe osteoarthritis of the left knee with bone on bone. 2. He also had a flexion contracture of the left knee.  OPERATION: 1. Release of flexion contracture left knee. 2. Left total knee arthroplasty utilizing the DePuy system, all 3     components were cemented.  Sizes used were a size 4 femur right and     left was a posterior cruciate sacrificing type, tibial tray was a     size 5.  The insert was a size 4, 12.5 mm thickness rotating     platform.  The patella was a size 41 patella with 3 peg.  Note,     gentamicin was used in the cement.  SURGEON:  Kipp Brood. Gladstone Lighter, M.D.  ASSISTANT:  Ardeen Jourdain, [place]  DESCRIPTION OF PROCEDURE:  Under general anesthesia, routine orthopedic prep and draping of left lower extremity was carried out.  The appropriate time-out was first carried out.  I also marked the appropriate left leg in the holding area.  Also, he had 2 g of IV Ancef prior to surgery.  At this time, the leg was exsanguinated with an Esmarch after the sterile prep and draping was carried out and the tourniquet was elevated at 325 mmHg.  Note, we did utilize a Nurse, children's.  With the knee flexed, an anterior approach to the knee was carried out.  Bleeders were identified and cauterized.  Self-retaining retractors were inserted.  I then carried out a median parapatellar incision.  I reflected the patella laterally and then did medial and lateral meniscectomies.  Note, his knee was  extremely tight.  There was marked osteophytic changes noted.  The bone was extremely hard.  We took a great deal of time to carefully do the procedure because of the above. At this time, initial drill hole was made in the intercondylar notch. The guidewire was inserted.  I thoroughly irrigated out the femoral canal.  Following that, the #1 jig was inserted.  I removed 14 mm thickness off the distal femur at this time.  At that time, we completed our synovectomy as well.  Following that, I then measured the femur to be a size 4 left and then I inserted the next jig and did my anterior- posterior and chamfering cuts in the usual fashion for a size 4 left femoral component.  Attention then was directed to the tibia.  We removed approximately 8 mm thickness off of the tibia utilizing the medial plateau with the guide.  Note, this knee was extremely tight.  He was extremely large man, I want to make sure we had a good solid insert. After the tibia was cut, we  recheck it and the tray measured to be a size 5 tray.  We then went on and inserted our lamina spreaders, removed the posterior osteophytes, he had a large osteophyte medially.  After that, we then cut our box cut off the femur after though we did our reconstruction of tibial component.  Note, we utilized the revision tibial component because of the size and the degree of the bone problem. We then cut our keel cut and then went and did our notch cut out of the distal femur in usual fashion.  We then inserted our trial components, we had an excellent fit.  We first tried a 10 mm thickness and we went to a 12.5 mm thickness.  Following that, we then did a resurfacing procedure on the patella for a size 41 patella.  Three drill holes were made in the patella.  All trial components were removed.  We thoroughly water picked out the knee and then flexed the knee.  We then cemented all 3 components simultaneously.  Once the cement was hardened,  we did search and remove loose pieces of cement, then we irrigated out with the pulse irrigation system.  Note, gentamicin was used in the cement.  Once again, we looked for loose pieces of cement.  We then inserted our some thrombin-soaked Gelfoam posteriorly.  I then inserted my permanent rotating platform size 4, 12.5 mm thickness, reduced the knee and had excellent function.  We had excellent medial and lateral stability as well.  We then closed the knee in layers over Hemovac drain.  The remaining part of the wound was closed in the usual fashion.  Note, at the end of the procedure, we injected a mixture of 20 mL of Exparel with 20 mL of normal saline.  The patient did have 2 g of IV Ancef at the beginning procedure.  I mentioned also we gave him the tranexamic acid IV.          ______________________________ Kipp Brood. Gladstone Lighter, M.D.     RAG/MEDQ  D:  05/24/2014  T:  05/24/2014  Job:  [id number]

## 2014-05-24 NOTE — Brief Op Note (Signed)
05/24/2014  4:22 PM  PATIENT:  Janine Ores  63 y.o. male  PRE-OPERATIVE DIAGNOSIS:  OSTEOARTHRITIS OF LEFT KNEE  POST-OPERATIVE DIAGNOSIS:  OSTEOARTHRITIS OF LEFT KNEE  PROCEDURE:  Procedure(s): LEFT TOTAL KNEE ARTHROPLASTY (Left),Complex.  SURGEON:  Surgeon(s) and Role:    * Tobi Bastos, MD - Primary  PHYSICIAN ASSISTANT: Ardeen Jourdain PA  ASSISTANTS: Ardeen Jourdain PA   ANESTHESIA:   general  EBL:  Total I/O In: 1000 [I.V.:1000] Out: 840 [Urine:840]  BLOOD ADMINISTERED:none  DRAINS: (One) Hemovact drain(s) in the Left Knee with  Suction Open   LOCAL MEDICATIONS USED:  BUPIVICAINE 20cc mixed with 20cc of Normal Saline  SPECIMEN:  No Specimen  DISPOSITION OF SPECIMEN:  N/A  COUNTS:  YES  TOURNIQUET:  * Missing tourniquet times found for documented tourniquets in log:  [id number] *  DICTATION: .Other Dictation: Dictation Number [id number]  PLAN OF CARE: Admit to inpatient   PATIENT DISPOSITION:  Stable in OR   Delay start of Pharmacological VTE agent (>24hrs) due to surgical blood loss or risk of bleeding: yes

## 2014-05-24 NOTE — Interval H&P Note (Signed)
History and Physical Interval Note:  05/24/2014 1:21 PM  [name] [name]  has presented today for surgery, with the diagnosis of OA OF LEFT KNEE  The various methods of treatment have been discussed with the patient and family. After consideration of risks, benefits and other options for treatment, the patient has consented to  Procedure(s): LEFT TOTAL KNEE ARTHROPLASTY (Left) as a surgical intervention .  The patient's history has been reviewed, patient examined, no change in status, stable for surgery.  I have reviewed the patient's chart and labs.  Questions were answered to the patient's satisfaction.     [name]

## 2014-05-25 ENCOUNTER — Encounter (HOSPITAL_COMMUNITY): Payer: Self-pay | Admitting: Orthopedic Surgery

## 2014-05-25 LAB — BASIC METABOLIC PANEL
Anion gap: 12 (ref 5–15)
BUN: 12 mg/dL (ref 6–23)
CO2: 26 meq/L (ref 19–32)
CREATININE: 0.85 mg/dL (ref 0.50–1.35)
Calcium: 8.7 mg/dL (ref 8.4–10.5)
Chloride: 100 mEq/L (ref 96–112)
GFR calc Af Amer: >90 mL/min (ref >90)
GFR calc non Af Amer: >90 mL/min (ref >90)
Glucose, Bld: 182 mg/dL — ABNORMAL HIGH (ref 70–99)
Potassium: 5 mEq/L (ref 3.7–5.3)
Sodium: 138 mEq/L (ref 137–147)

## 2014-05-25 LAB — CBC
HCT: 37.6 % — ABNORMAL LOW (ref 39.0–52.0)
Hemoglobin: 12.8 g/dL — ABNORMAL LOW (ref 13.0–17.0)
MCH: 29.9 pg (ref 26.0–34.0)
MCHC: 34 g/dL (ref 30.0–36.0)
MCV: 87.9 fL (ref 78.0–100.0)
Platelets: 230 10*3/uL (ref 150–400)
RBC: 4.28 MIL/uL (ref 4.22–5.81)
RDW: 14 % (ref 11.5–15.5)
WBC: 8.6 10*3/uL (ref 4.0–10.5)

## 2014-05-25 LAB — GLUCOSE, CAPILLARY
GLUCOSE-CAPILLARY: 137 mg/dL — AB (ref 70–99)
Glucose-Capillary: 232 mg/dL — ABNORMAL HIGH (ref 70–99)
Glucose-Capillary: 144 mg/dL — ABNORMAL HIGH (ref 70–99)
Glucose-Capillary: 142 mg/dL — ABNORMAL HIGH (ref 70–99)
Glucose-Capillary: 167 mg/dL — ABNORMAL HIGH (ref 70–99)

## 2014-05-25 MED ORDER — FERROUS SULFATE 325 (65 FE) MG PO TABS: 325.0000 mg | ORAL_TABLET | Freq: Three times a day (TID) | ORAL | Status: DC

## 2014-05-25 MED ORDER — DIPHENHYDRAMINE HCL 25 MG PO CAPS
25.0000 mg | ORAL_CAPSULE | Freq: Four times a day (QID) | ORAL | Status: DC | PRN
  Administered 2014-05-25 (×3): 25 mg via ORAL
  Filled 2014-05-25 (×3): qty 1

## 2014-05-25 MED ORDER — RIVAROXABAN 10 MG PO TABS: 10.0000 mg | ORAL_TABLET | Freq: Every day | ORAL | Status: DC

## 2014-05-25 MED ORDER — DSS 100 MG PO CAPS: 100.0000 mg | ORAL_CAPSULE | Freq: Two times a day (BID) | ORAL | Status: DC

## 2014-05-25 MED ORDER — METHOCARBAMOL 500 MG PO TABS: 500.0000 mg | ORAL_TABLET | Freq: Four times a day (QID) | ORAL | Status: DC | PRN

## 2014-05-25 MED ORDER — OXYCODONE-ACETAMINOPHEN 5-325 MG PO TABS: 1.0000 | ORAL_TABLET | ORAL | Status: DC | PRN

## 2014-05-25 NOTE — Care Management Note (Signed)
    Page 1 of 1   05/25/2014     12:16:25 PM CARE MANAGEMENT NOTE 05/25/2014  Patient:  [name] [name], [name] [name]   Account Number:  [id number]  Date Initiated:  05/25/2014  Documentation initiated by:  [facility]  Subjective/Objective Assessment:   adm:L TKA     Action/Plan:   discharge planning   Anticipated DC Date:  05/27/2014   Anticipated DC Plan:  [place]  CM consult      [facility] Choice  HOME HEALTH   Choice offered to / List presented to:  C-1 Patient        [place] arranged  HH-2 PT      [employer]   Status of service:  Completed, signed off Medicare Important Message given?   (If response is "NO", the following Medicare IM given date fields will be blank) Date Medicare IM given:   Medicare IM given by:   Date Additional Medicare IM given:   Additional Medicare IM given by:    Discharge Disposition:  [place]  Per UR Regulation:    If discussed at Long Length of Stay Meetings, dates discussed:    Comments:  05/25/14 11:15 CM met with pt to confirm choice of home health agency is [employer] rep aware.  No DME needed; pt has both a walker and bedside commode at home. No other CM needs were communicated.  Mariane Masters, BSN, [phone].

## 2014-05-25 NOTE — Progress Notes (Signed)
Subjective: 1 Day Post-Op Procedure(s) (LRB): LEFT TOTAL KNEE ARTHROPLASTY (Left) Patient reports pain as 3 on 0-10 scale.Hemovac DCd. Doing very well.    Objective: Vital signs in last 24 hours: Temp:  [97.4 F (36.3 C)-99 F (37.2 C)] 98.2 F (36.8 C) (08/20 0629) Pulse Rate:  [76-125] 78 (08/20 0629) Resp:  [15-26] 16 (08/20 0629) BP: (111-163)/(71-114) 123/96 mmHg (08/20 0629) SpO2:  [93 %-100 %] 97 % (08/20 0629) Weight:  [129.275 kg (285 lb)] 129.275 kg (285 lb) (08/19 1147)  Intake/Output from previous day: 08/19 0701 - 08/20 0700 In: 3298.3 [I.V.:2983.3; IV Piggyback:315] Out: 3180 [HYQMV:7846; Drains:60; Blood:50] Intake/Output this shift:     Recent Labs  05/25/14 0410  HGB 12.8*    Recent Labs  05/25/14 0410  WBC 8.6  RBC 4.28  HCT 37.6*  PLT 230    Recent Labs  05/25/14 0410  NA 138  K 5.0  CL 100  CO2 26  BUN 12  CREATININE 0.85  GLUCOSE 182*  CALCIUM 8.7   No results found for this basename: LABPT, INR,  in the last 72 hours  Dorsiflexion/Plantar flexion intact  Assessment/Plan: 1 Day Post-Op Procedure(s) (LRB): LEFT TOTAL KNEE ARTHROPLASTY (Left) Up with therapy  Bearl Talarico A 05/25/2014, 7:22 AM

## 2014-05-25 NOTE — Evaluation (Addendum)
Physical Therapy Evaluation Patient Details Name: [name] [name] MRN: [id number] DOB: 1951-07-03 Today's Date: 05/25/2014   History of Present Illness  L TKA;    PMHx: R TKA ~yr ago, DM, HTN, gou  Clinical Impression  Pt will benefit from PT to address deficits below; should progress well;    Follow Up Recommendations Home health PT    Equipment Recommendations  None recommended by PT    Recommendations for Other Services       Precautions / Restrictions Precautions Precautions: Knee Required Braces or Orthoses: Knee Immobilizer - Left Restrictions Weight Bearing Restrictions: No Other Position/Activity Restrictions: WBAT      Mobility  Bed Mobility Overal bed mobility: Needs Assistance Bed Mobility: Supine to Sit     Supine to sit: Min assist     General bed mobility comments: min with RLE  Transfers Overall transfer level: Needs assistance Equipment used: Rolling walker (2 wheeled) Transfers: Sit to/from Stand Sit to Stand: Min assist         General transfer comment: bed elevated slightly for transfer, cues for technique  Ambulation/Gait Ambulation/Gait assistance: Min assist Ambulation Distance (Feet): 90 Feet Assistive device: Rolling walker (2 wheeled) Gait Pattern/deviations: Step-to pattern     General Gait Details: cues for sequence and Rw position  Stairs            Wheelchair Mobility    Modified Rankin (Stroke Patients Only)       Balance                                             Pertinent Vitals/Pain Pain Assessment: 0-10 Pain Score: 8  Pain Location: L knee Pain Intervention(s): Limited activity within patient's tolerance;Monitored during session;Ice applied    Home Living Family/patient expects to be discharged to:: Private residence Living Arrangements: Children Available Help at Discharge: Family Type of Home: House Home Access: [place]: One [place]:  Environmental consultant - 2 wheels;Bedside commode Additional Comments: didn't use BSC last year    Prior Function Level of Independence: Independent               Hand Dominance        Extremity/Trunk Assessment   Upper Extremity Assessment: Defer to OT evaluation           Lower Extremity Assessment: LLE deficits/detail   LLE Deficits / Details: ankle WFL, able to assist with SLR, knee AAROM 10-45*  Cervical / Trunk Assessment: Normal  Communication   Communication: No difficulties  Cognition Arousal/Alertness: Awake/alert Behavior During Therapy: WFL for tasks assessed/performed Overall Cognitive Status: Within Functional Limits for tasks assessed                      General Comments      Exercises Total Joint Exercises Ankle Circles/Pumps: AROM;Both;10 reps Quad Sets: 5 reps;AROM;Both Heel Slides: AAROM;10 reps;Left      Assessment/Plan    PT Assessment Patient needs continued PT services  PT Diagnosis Difficulty walking   PT Problem List Decreased strength;Decreased range of motion;Decreased mobility;Decreased knowledge of precautions  PT Treatment Interventions DME instruction;Gait training;Functional mobility training;Therapeutic activities;Therapeutic exercise;Patient/family education   PT Goals (Current goals can be found in the Care Plan section) Acute Rehab PT Goals Patient Stated Goal: home, independent and less pain PT Goal Formulation: With patient Time  For Goal Achievement: 06/01/14 Potential to Achieve Goals: Good    Frequency 7X/week   Barriers to discharge        Co-evaluation               End of Session Equipment Utilized During Treatment: Gait belt;Left knee immobilizer Activity Tolerance: Patient tolerated treatment well Patient left: in chair;with call bell/phone within reach;with family/visitor present Nurse Communication: Mobility status         Time: 1000-1029 PT Time Calculation (min): 29 min   Charges:   PT  Evaluation $Initial PT Evaluation Tier I: 1 Procedure PT Treatments $Gait Training: 23-37 mins   PT G Codes:          Morene Cecilio 2014/05/30, 10:40 AM

## 2014-05-25 NOTE — Evaluation (Addendum)
Occupational Therapy Evaluation Patient Details Name: PRAISE DOLECKI MRN: [id number] DOB: 1951/09/08 Today's Date: 05/25/2014    History of Present Illness L TKA; PMHx: R TKA ~yr ago, DM, HTN, gout   Clinical Impression   Pt practiced toilet and shower transfer with min assist and walker. He has assist at home PRN including for LB self care. All education completed and OT to sign off.    Follow Up Recommendations  No OT follow up;Supervision/Assistance - 24 hour    Equipment Recommendations  None recommended by OT    Recommendations for Other Services       Precautions / Restrictions Precautions Precautions: Knee Required Braces or Orthoses: Knee Immobilizer - Left Restrictions Weight Bearing Restrictions: No Other Position/Activity Restrictions: WBAT      Mobility Bed Mobility Overal bed mobility: Needs Assistance Bed Mobility: Supine to Sit     Supine to sit: Min assist     General bed mobility comments: min with RLE  Transfers Overall transfer level: Needs assistance Equipment used: Rolling walker (2 wheeled) Transfers: Sit to/from Stand Sit to Stand: Min assist         General transfer comment: bed elevated slightly for transfer, cues for technique    Balance                                            ADL Overall ADL's : Needs assistance/impaired Eating/Feeding: Independent;Sitting   Grooming: Wash/dry hands;Set up;Sitting   Upper Body Bathing: Set up;Sitting   Lower Body Bathing: Minimal assistance;Sit to/from stand   Upper Body Dressing : Set up;Sitting   Lower Body Dressing: Moderate assistance;Sit to/from stand   Toilet Transfer: Minimal assistance;Ambulation;Comfort height toilet;RW;Grab bars   Toileting- Clothing Manipulation and Hygiene: Minimal assistance;Sit to/from stand   Tub/ Shower Transfer: Walk-in shower;Minimal assistance;Rolling walker     General ADL Comments: Pt states family and girlfriend can assist  at d/c. He has a 3in1 but recently had a higher toilet put in with a grab bar. He did transfer on and off higher toilet here with min assist for safety. Practiced shower transfer and he was able to step over ledge with min assist and walker. Discussed safety with having initial assist with showering, using shower chair and having family steady walker as he steps over ledge. Introduced AE and demonstrated all AE. He states he has family that can help with LB self care but he may also decide to purchase AE kit. Feel he will progress with PT for functional transfers. All OT education completed with pt and family. Educated on [place] and when to wear as well as how to don/doff. Discussed sequence for LB self care and have walker in front when he stands to pull up clothing.      Vision                     Perception     Praxis      Pertinent Vitals/Pain Pain Assessment: 0-10 Pain Score: 8  Pain Location: L knee Pain Descriptors / Indicators: Aching Pain Intervention(s): Limited activity within patient's tolerance;Monitored during session;Ice applied     Hand Dominance     Extremity/Trunk Assessment Upper Extremity Assessment Upper Extremity Assessment: Overall WFL for tasks assessed      Cervical / Trunk Assessment Cervical / Trunk Assessment: Normal   Communication Communication Communication: No difficulties  Cognition Arousal/Alertness: Awake/alert Behavior During Therapy: WFL for tasks assessed/performed Overall Cognitive Status: Within Functional Limits for tasks assessed                     General Comments       Exercises       Shoulder Instructions      Home Living Family/patient expects to be discharged to:: Private residence Living Arrangements: Children Available Help at Discharge: Family Type of Home: House Home Access: Ramped entrance     Home Layout: One level     Bathroom Shower/Tub: Hospital doctor Toilet: Handicapped  height     Home Equipment: Environmental consultant - 2 wheels;Bedside commode;Shower seat;Grab bars - toilet   Additional Comments: didn't use BSC last year      Prior Functioning/Environment Level of Independence: Independent             OT Diagnosis:     OT Problem List:     OT Treatment/Interventions:      OT Goals(Current goals can be found in the care plan section) Acute Rehab OT Goals Patient Stated Goal: home, independent and less pain  OT Frequency:     Barriers to D/C:            Co-evaluation              End of Session Equipment Utilized During Treatment: Gait belt;Rolling walker;Left knee immobilizer  Activity Tolerance: Patient tolerated treatment well Patient left: in chair;with call bell/phone within reach;with family/visitor present   Time: 9150-4136 OT Time Calculation (min): 19 min Charges:  OT General Charges $OT Visit: 1 Procedure OT Evaluation $Initial OT Evaluation Tier I: 1 Procedure OT Treatments $Therapeutic Activity: 8-22 mins G-Codes:    Jules Schick [phone] 05/25/2014, 11:08 AM

## 2014-05-25 NOTE — Progress Notes (Signed)
05/25/14 1400  PT Visit Information  Last PT Received On 05/25/14  Assistance Needed +1  History of Present Illness L TKA; PMHx: R TKA ~yr ago, DM, HTN, gout  PT Time Calculation  PT Start Time 1333  PT Stop Time 1358  PT Time Calculation (min) 25 min  Subjective Data  Patient Stated Goal home, independent and less pain  Precautions  Precautions Knee  Required Braces or Orthoses Knee Immobilizer - Left  Restrictions  Weight Bearing Restrictions No  Other Position/Activity Restrictions WBAT  Pain Assessment  Pain Assessment 0-10  Pain Score 2  Pain Location L knee  Pain Intervention(s) Repositioned;Ice applied;RN gave pain meds during session  Cognition  Arousal/Alertness Awake/alert  Behavior During Therapy WFL for tasks assessed/performed  Overall Cognitive Status Within Functional Limits for tasks assessed  Bed Mobility  Overal bed mobility Needs Assistance  Bed Mobility Sit to Supine  Sit to supine Min assist  General bed mobility comments min with RLE  Transfers  Overall transfer level Needs assistance  Equipment used Rolling walker (2 wheeled)  Transfers Sit to/from Stand  Sit to Stand Min guard  General transfer comment verbal cues for safe technique, hand placement  Ambulation/Gait  Ambulation/Gait assistance Min guard;Supervision  Ambulation Distance (Feet) 120 Feet  Assistive device Rolling walker (2 wheeled)  Gait Pattern/deviations Step-to pattern  General Gait Details cues for sequence and Rw position  Total Joint Exercises  Ankle Circles/Pumps AROM;Both;10 reps  Short [place] [employer];Left;10 reps  Hip ABduction/ADduction AROM;AAROM;Left;10 reps  Straight Leg Raises AAROM;AROM;Left;10 reps  Goniometric ROM grossly 10-48*  PT - End of Session  Equipment Utilized During Treatment Gait belt;Left knee immobilizer  Activity Tolerance Patient tolerated treatment well  Patient left with call bell/phone within reach;with family/visitor present;in bed   Nurse Communication Mobility status  PT - Assessment/Plan  PT Plan Current plan remains appropriate  PT Frequency 7X/week  Follow Up Recommendations Home health PT  PT equipment None recommended by PT  PT Goal Progression  Progress towards PT goals Progressing toward goals  Acute Rehab PT Goals  PT Goal Formulation With patient  Time For Goal Achievement 06/01/14  Potential to Achieve Goals Good  PT General Charges  $$ ACUTE PT VISIT 1 Procedure  PT Treatments  $Gait Training 8-22 mins  $Therapeutic Exercise 8-22 mins

## 2014-05-25 NOTE — Discharge Instructions (Addendum)
Walk with your walker. Weight bearing as tolerated [place] will follow you at home for your therapy  Do not change your dressing over the incision unless there is excess drainage.  Change dressing over drain site as needed Shower only, no tub bath. Call if any temperatures greater than 101 or any wound complications: [phone] during the day and ask for Dr. Charlestine Night nurse, Brunilda Payor. ====  Information on my medicine - XARELTO (Rivaroxaban)  This medication education was reviewed with me or my healthcare representative as part of my discharge preparation.  The pharmacist that spoke with me during my hospital stay was:  Daveigh Batty, Julieta Bellini, RPH  Why was Xarelto prescribed for you? Xarelto was prescribed for you to reduce the risk of blood clots forming after orthopedic surgery. The medical term for these abnormal blood clots is venous thromboembolism (VTE).  What do you need to know about xarelto ? Take your Xarelto ONCE DAILY at the same time every day. You may take it either with or without food.  If you have difficulty swallowing the tablet whole, you may crush it and mix in applesauce just prior to taking your dose.  Take Xarelto exactly as prescribed by your doctor and DO NOT stop taking Xarelto without talking to the doctor who prescribed the medication.  Stopping without other VTE prevention medication to take the place of Xarelto may increase your risk of developing a clot.  After discharge, you should have regular check-up appointments with your healthcare provider that is prescribing your Xarelto.    What do you do if you miss a dose? If you miss a dose, take it as soon as you remember on the same day then continue your regularly scheduled once daily regimen the next day. Do not take two doses of Xarelto on the same day.   Important Safety Information A possible side effect of Xarelto is bleeding. You should call your healthcare provider right away if you  experience any of the following:   Bleeding from an injury or your nose that does not stop.   Unusual colored urine (red or dark brown) or unusual colored stools (red or black).   Unusual bruising for unknown reasons.   A serious fall or if you hit your head (even if there is no bleeding).  Some medicines may interact with Xarelto and might increase your risk of bleeding while on Xarelto. To help avoid this, consult your healthcare provider or pharmacist prior to using any new prescription or non-prescription medications, including herbals, vitamins, non-steroidal anti-inflammatory drugs (NSAIDs) and supplements.  This website has more information on Xarelto: [email or web address]

## 2014-05-26 LAB — BASIC METABOLIC PANEL
ANION GAP: 8 (ref 5–15)
BUN: 15 mg/dL (ref 6–23)
CO2: 30 mEq/L (ref 19–32)
Calcium: 8.7 mg/dL (ref 8.4–10.5)
Chloride: 98 mEq/L (ref 96–112)
Creatinine, Ser: 1.05 mg/dL (ref 0.50–1.35)
GFR calc Af Amer: 87 mL/min — ABNORMAL LOW (ref >90)
GFR, EST NON AFRICAN AMERICAN: 75 mL/min — AB (ref >90)
Glucose, Bld: 161 mg/dL — ABNORMAL HIGH (ref 70–99)
Potassium: 3.9 mEq/L (ref 3.7–5.3)
SODIUM: 136 meq/L — AB (ref 137–147)

## 2014-05-26 LAB — CBC
HCT: 34.2 % — ABNORMAL LOW (ref 39.0–52.0)
Hemoglobin: 11.4 g/dL — ABNORMAL LOW (ref 13.0–17.0)
MCH: 29.9 pg (ref 26.0–34.0)
MCHC: 33.3 g/dL (ref 30.0–36.0)
MCV: 89.8 fL (ref 78.0–100.0)
PLATELETS: 201 10*3/uL (ref 150–400)
RBC: 3.81 MIL/uL — ABNORMAL LOW (ref 4.22–5.81)
RDW: 14.1 % (ref 11.5–15.5)
WBC: 7.2 10*3/uL (ref 4.0–10.5)

## 2014-05-26 LAB — GLUCOSE, CAPILLARY: Glucose-Capillary: 109 mg/dL — ABNORMAL HIGH (ref 70–99)

## 2014-05-26 MED ORDER — OXYCODONE HCL 5 MG PO TABS: 5.0000 mg | ORAL_TABLET | ORAL | Status: DC | PRN

## 2014-05-26 MED ORDER — OXYCODONE HCL 5 MG PO TABS
5.0000 mg | ORAL_TABLET | ORAL | Status: DC | PRN
  Administered 2014-05-26: 15 mg via ORAL
  Filled 2014-05-26: qty 3

## 2014-05-26 NOTE — Progress Notes (Signed)
Physical Therapy Treatment Patient Details Name: [name] [name] MRN: [id number] DOB: 05-19-1951 Today's Date: 05/26/2014    History of Present Illness L TKA; PMHx: R TKA ~yr ago, DM, HTN, gout    PT Comments    POD # 2 am session pt dressed and eager to D/C to home.  Assisted with amb and instructed on KI use for amb and proper application.  Given handout TKR HEP and performed all supine TE's while instructing on freq and proper tech.  Instructed on use of ICE and freq.  Pt progressing well and plans to D/c to home today.  Follow Up Recommendations  Home health PT     Equipment Recommendations       Recommendations for Other Services       Precautions / Restrictions Precautions Precautions: Knee Precaution Comments: Instructed pt and sig other on KI use for amb and proper application Required Braces or Orthoses: Knee Immobilizer - Left Restrictions Weight Bearing Restrictions: No Other Position/Activity Restrictions: WBAT    Mobility  Bed Mobility               General bed mobility comments: Pt OOB in recliner  Transfers Overall transfer level: Needs assistance Equipment used: Rolling walker (2 wheeled) Transfers: Sit to/from Stand Sit to Stand: Supervision;Min guard         General transfer comment: verbal cues for safe technique, hand placement plus increased time  Ambulation/Gait Ambulation/Gait assistance: Min guard;Supervision Ambulation Distance (Feet): 125 Feet Assistive device: Rolling walker (2 wheeled) Gait Pattern/deviations: Step-to pattern;Decreased stance time - left Gait velocity: decreased   General Gait Details: cues for sequence and Rw position esp during turns   Stairs Stairs:  (pt does not have any steps to enter home)          Wheelchair Mobility    Modified Rankin (Stroke Patients Only)       Balance                                    Cognition                            Exercises       General Comments        Pertinent Vitals/Pain      Home Living                      Prior Function            PT Goals (current goals can now be found in the care plan section) Progress towards PT goals: Progressing toward goals    Frequency  7X/week    PT Plan      Co-evaluation             End of Session Equipment Utilized During Treatment: Gait belt;Left knee immobilizer Activity Tolerance: Patient tolerated treatment well Patient left: in bed;with call bell/phone within reach     Time: 8676-1950 PT Time Calculation (min): 27 min  Charges:  $Gait Training: 8-22 mins $Therapeutic Exercise: 8-22 mins                    G Codes:      Rica Koyanagi  PTA [facility]  Acute  Rehab Pager      [phone]

## 2014-05-26 NOTE — Progress Notes (Signed)
Subjective: 2 Days Post-Op Procedure(s) (LRB): LEFT TOTAL KNEE ARTHROPLASTY (Left) Patient reports pain as 3 on 0-10 scale.  Doing well. Will DC  Objective: Vital signs in last 24 hours: Temp:  [97.9 F (36.6 C)-99 F (37.2 C)] 97.9 F (36.6 C) (08/21 0609) Pulse Rate:  [78-91] 85 (08/21 0609) Resp:  [15-18] 16 (08/21 0736) BP: (102-118)/(62-74) 102/62 mmHg (08/21 0609) SpO2:  [93 %-97 %] 93 % (08/21 0609)  Intake/Output from previous day: 08/20 0701 - 08/21 0700 In: 976.7 [P.O.:600; I.V.:376.7] Out: 575 [Urine:575] Intake/Output this shift:     Recent Labs  05/25/14 0410 05/26/14 0417  HGB 12.8* 11.4*    Recent Labs  05/25/14 0410 05/26/14 0417  WBC 8.6 7.2  RBC 4.28 3.81*  HCT 37.6* 34.2*  PLT 230 201    Recent Labs  05/25/14 0410 05/26/14 0417  NA 138 136*  K 5.0 3.9  CL 100 98  CO2 26 30  BUN 12 15  CREATININE 0.85 1.05  GLUCOSE 182* 161*  CALCIUM 8.7 8.7   No results found for this basename: LABPT, INR,  in the last 72 hours  Dorsiflexion/Plantar flexion intact  Assessment/Plan: 2 Days Post-Op Procedure(s) (LRB): LEFT TOTAL KNEE ARTHROPLASTY (Left) Discharge home with home health  Gwenevere Goga A 05/26/2014, 7:38 AM

## 2014-05-29 NOTE — Discharge Summary (Signed)
Physician Discharge Summary   Patient ID: [name] [name] MRN: [id number] DOB/AGE: [birth year] 63 y.o.  Admit date: 05/24/2014 Discharge date: 05/26/2014  Primary Diagnosis: Osteoarthritis, left knee   Admission Diagnoses:  Past Medical History  Diagnosis Date  . Diabetes mellitus without complication   . Hypertension   . Arthritis     knees- hx. gout  . Hyperlipidemia   . History of gout   . Psoriasis     "one spot on my rt leg"   Discharge Diagnoses:   Active Problems:   Osteoarthritis of left knee   Total knee replacement status  Estimated body mass index is 43.34 kg/(m^2) as calculated from the following:   Height as of this encounter: $RemoveBeforeD'5\' 8"'[id number]$  (1.727 m).   Weight as of this encounter: 129.275 kg (285 lb).  Procedure:  Procedure(s) (LRB): LEFT TOTAL KNEE ARTHROPLASTY (Left)   Consults: None  HPI: [name] [name], 63 y.o. male, has a history of pain and functional disability in the left knee due to arthritis and has failed non-surgical conservative treatments for greater than 12 weeks to includeNSAID's and/or analgesics, corticosteriod injections, weight reduction as appropriate and activity modification. Onset of symptoms was gradual, starting 10 years ago with gradually worsening course since that time. The patient noted no past surgery on the left knee(s). Patient currently rates pain in the left knee(s) at 7 out of 10 with activity. Patient has night pain, worsening of pain with activity and weight bearing, pain that interferes with activities of daily living, pain with passive range of motion, crepitus and joint swelling. Patient has evidence of periarticular osteophytes and joint space narrowing by imaging studies. There is no active infection.   Laboratory Data: Admission on 05/24/2014, Discharged on 05/26/2014  Component Date Value Ref Range Status  . ABO/RH(D) 05/24/2014 O POS   Final  . Antibody Screen 05/24/2014 NEG   Final  . Sample Expiration 05/24/2014 05/27/2014    Final  . Glucose-Capillary 05/24/2014 111* 70 - 99 mg/dL Final  . Glucose-Capillary 05/24/2014 168* 70 - 99 mg/dL Final  . WBC 05/25/2014 8.6  4.0 - 10.5 K/uL Final  . RBC 05/25/2014 4.28  4.22 - 5.81 MIL/uL Final  . Hemoglobin 05/25/2014 12.8* 13.0 - 17.0 g/dL Final  . HCT 05/25/2014 37.6* 39.0 - 52.0 % Final  . MCV 05/25/2014 87.9  78.0 - 100.0 fL Final  . MCH 05/25/2014 29.9  26.0 - 34.0 pg Final  . MCHC 05/25/2014 34.0  30.0 - 36.0 g/dL Final  . RDW 05/25/2014 14.0  11.5 - 15.5 % Final  . Platelets 05/25/2014 230  150 - 400 K/uL Final  . Sodium 05/25/2014 138  137 - 147 mEq/L Final  . Potassium 05/25/2014 5.0  3.7 - 5.3 mEq/L Final  . Chloride 05/25/2014 100  96 - 112 mEq/L Final  . CO2 05/25/2014 26  19 - 32 mEq/L Final  . Glucose, Bld 05/25/2014 182* 70 - 99 mg/dL Final  . BUN 05/25/2014 12  6 - 23 mg/dL Final  . Creatinine, Ser 05/25/2014 0.85  0.50 - 1.35 mg/dL Final  . Calcium 05/25/2014 8.7  8.4 - 10.5 mg/dL Final  . GFR calc non Af Amer 05/25/2014 >90  >90 mL/min Final  . GFR calc Af Amer 05/25/2014 >90  >90 mL/min Final   Comment: (NOTE)                          The eGFR has been calculated using  the CKD EPI equation.                          This calculation has not been validated in all clinical situations.                          eGFR's persistently <90 mL/min signify possible Chronic Kidney                          Disease.  . Anion gap 05/25/2014 12  5 - 15 Final  . Glucose-Capillary 05/24/2014 182* 70 - 99 mg/dL Final  . Glucose-Capillary 05/24/2014 232* 70 - 99 mg/dL Final  . Glucose-Capillary 05/25/2014 144* 70 - 99 mg/dL Final  . Comment 1 05/25/2014 Notify RN   Final  . Comment 2 05/25/2014 Documented in Chart   Final  . Glucose-Capillary 05/25/2014 142* 70 - 99 mg/dL Final  . Comment 1 05/25/2014 Notify RN   Final  . Comment 2 05/25/2014 Documented in Chart   Final  . WBC 05/26/2014 7.2  4.0 - 10.5 K/uL Final  . RBC 05/26/2014 3.81* 4.22 - 5.81 MIL/uL  Final  . Hemoglobin 05/26/2014 11.4* 13.0 - 17.0 g/dL Final  . HCT 05/26/2014 34.2* 39.0 - 52.0 % Final  . MCV 05/26/2014 89.8  78.0 - 100.0 fL Final  . MCH 05/26/2014 29.9  26.0 - 34.0 pg Final  . MCHC 05/26/2014 33.3  30.0 - 36.0 g/dL Final  . RDW 05/26/2014 14.1  11.5 - 15.5 % Final  . Platelets 05/26/2014 201  150 - 400 K/uL Final  . Sodium 05/26/2014 136* 137 - 147 mEq/L Final  . Potassium 05/26/2014 3.9  3.7 - 5.3 mEq/L Final   DELTA CHECK NOTED  . Chloride 05/26/2014 98  96 - 112 mEq/L Final  . CO2 05/26/2014 30  19 - 32 mEq/L Final  . Glucose, Bld 05/26/2014 161* 70 - 99 mg/dL Final  . BUN 05/26/2014 15  6 - 23 mg/dL Final  . Creatinine, Ser 05/26/2014 1.05  0.50 - 1.35 mg/dL Final  . Calcium 05/26/2014 8.7  8.4 - 10.5 mg/dL Final  . GFR calc non Af Amer 05/26/2014 75* >90 mL/min Final  . GFR calc Af Amer 05/26/2014 87* >90 mL/min Final   Comment: (NOTE)                          The eGFR has been calculated using the CKD EPI equation.                          This calculation has not been validated in all clinical situations.                          eGFR's persistently <90 mL/min signify possible Chronic Kidney                          Disease.  . Anion gap 05/26/2014 8  5 - 15 Final  . Glucose-Capillary 05/25/2014 137* 70 - 99 mg/dL Final  . Comment 1 05/25/2014 Notify RN   Final  . Comment 2 05/25/2014 Documented in Chart   Final  . Glucose-Capillary 05/25/2014 167* 70 - 99 mg/dL Final  . Glucose-Capillary 05/26/2014 109* 70 - 99 mg/dL Final  Hospital Outpatient Visit on 05/16/2014  Component Date Value Ref Range Status  . WBC 05/16/2014 6.9  4.0 - 10.5 K/uL Final  . RBC 05/16/2014 5.21  4.22 - 5.81 MIL/uL Final  . Hemoglobin 05/16/2014 15.7  13.0 - 17.0 g/dL Final  . HCT 04/59/9774 45.6  39.0 - 52.0 % Final  . MCV 05/16/2014 87.5  78.0 - 100.0 fL Final  . MCH 05/16/2014 30.1  26.0 - 34.0 pg Final  . MCHC 05/16/2014 34.4  30.0 - 36.0 g/dL Final  . RDW 14/23/9532 13.8   11.5 - 15.5 % Final  . Platelets 05/16/2014 255  150 - 400 K/uL Final  . MRSA, PCR 05/16/2014 NEGATIVE  NEGATIVE Final  . Staphylococcus aureus 05/16/2014 NEGATIVE  NEGATIVE Final   Comment:                                 The Xpert SA Assay (FDA                          approved for NASAL specimens                          in patients over 69 years of age),                          is one component of                          a comprehensive surveillance                          program.  Test performance has                          been validated by [employer] for patients greater                          than or equal to 25 year old.                          It is not intended                          to diagnose infection nor to                          guide or monitor treatment.  Marland Kitchen aPTT 05/16/2014 28  24 - 37 seconds Final  . Sodium 05/16/2014 138  137 - 147 mEq/L Final  . Potassium 05/16/2014 5.2  3.7 - 5.3 mEq/L Final  . Chloride 05/16/2014 99  96 - 112 mEq/L Final  . CO2 05/16/2014 28  19 - 32 mEq/L Final  . Glucose, Bld 05/16/2014 139* 70 - 99 mg/dL Final  . BUN 02/33/4356 15  6 - 23 mg/dL Final  . Creatinine, Ser 05/16/2014 0.92  0.50 - 1.35 mg/dL Final  . Calcium 86/16/8372 10.1  8.4 - 10.5 mg/dL Final  . Total Protein  05/16/2014 7.4  6.0 - 8.3 g/dL Final  . Albumin 05/16/2014 4.2  3.5 - 5.2 g/dL Final  . AST 05/16/2014 18  0 - 37 U/L Final  . ALT 05/16/2014 7  0 - 53 U/L Final  . Alkaline Phosphatase 05/16/2014 65  39 - 117 U/L Final  . Total Bilirubin 05/16/2014 0.4  0.3 - 1.2 mg/dL Final  . GFR calc non Af Amer 05/16/2014 89* >90 mL/min Final  . GFR calc Af Amer 05/16/2014 >90  >90 mL/min Final   Comment: (NOTE)                          The eGFR has been calculated using the CKD EPI equation.                          This calculation has not been validated in all clinical situations.                          eGFR's persistently <90  mL/min signify possible Chronic Kidney                          Disease.  . Anion gap 05/16/2014 11  5 - 15 Final  . Prothrombin Time 05/16/2014 12.5  11.6 - 15.2 seconds Final  . INR 05/16/2014 0.93  0.00 - 1.49 Final  . Color, Urine 05/16/2014 YELLOW  YELLOW Final  . APPearance 05/16/2014 CLEAR  CLEAR Final  . Specific Gravity, Urine 05/16/2014 1.015  1.005 - 1.030 Final  . pH 05/16/2014 5.5  5.0 - 8.0 Final  . Glucose, UA 05/16/2014 NEGATIVE  NEGATIVE mg/dL Final  . Hgb urine dipstick 05/16/2014 NEGATIVE  NEGATIVE Final  . Bilirubin Urine 05/16/2014 NEGATIVE  NEGATIVE Final  . Ketones, ur 05/16/2014 NEGATIVE  NEGATIVE mg/dL Final  . Protein, ur 05/16/2014 NEGATIVE  NEGATIVE mg/dL Final  . Urobilinogen, UA 05/16/2014 1.0  0.0 - 1.0 mg/dL Final  . Nitrite 05/16/2014 NEGATIVE  NEGATIVE Final  . Leukocytes, UA 05/16/2014 NEGATIVE  NEGATIVE Final   MICROSCOPIC NOT DONE ON URINES WITH NEGATIVE PROTEIN, BLOOD, LEUKOCYTES, NITRITE, OR GLUCOSE <1000 mg/dL.     X-Rays:Dg Chest 2 View  05/16/2014   CLINICAL DATA:  Diabetes. Hypertension. Preop respiratory exam for left knee arthroplasty.  EXAM: CHEST  2 VIEW  COMPARISON:  12/01/2012  FINDINGS: The heart size and mediastinal contours are within normal limits. Both lungs are clear. The visualized skeletal structures are unremarkable.  IMPRESSION: No active cardiopulmonary disease.   Electronically Signed   By: Earle Gell M.D.   On: 05/16/2014 09:49    EKG: Orders placed during the hospital encounter of 12/01/12  . EKG 12-LEAD  . EKG 12-LEAD     Hospital Course: [name] [name] is a 63 y.o. who was admitted to [facility]. They were brought to the operating room on 05/24/2014 and underwent Procedure(s): LEFT TOTAL KNEE ARTHROPLASTY.  Patient tolerated the procedure well and was later transferred to the recovery room and then to the orthopaedic floor for postoperative care.  They were given PO and IV analgesics for pain control following  their surgery.  They were given 24 hours of postoperative antibiotics of  Anti-infectives   Start     Dose/Rate Route Frequency Ordered Stop   05/24/14 2000  ceFAZolin (ANCEF) IVPB 1 g/50 mL premix  1 g 100 mL/hr over 30 Minutes Intravenous Every 6 hours 05/24/14 1824 05/25/14 0155   05/24/14 1455  polymyxin B 500,000 Units, bacitracin 50,000 Units in sodium chloride irrigation 0.9 % 500 mL irrigation  Status:  Discontinued       As needed 05/24/14 1455 05/24/14 1652   05/24/14 0600  ceFAZolin (ANCEF) 3 g in dextrose 5 % 50 mL IVPB     3 g 160 mL/hr over 30 Minutes Intravenous On call to O.R. 05/23/14 1312 05/24/14 1410     and started on DVT prophylaxis in the form of Xarelto.   PT and OT were ordered for total joint protocol.  Discharge planning consulted to help with postop disposition and equipment needs.  Patient had a good night on the evening of surgery.  They started to get up OOB with therapy on day one. Hemovac drain was pulled without difficulty.  Dressing was changed post op day one due to bleeding from hemovac site. Incision looked clean and dry post op day 2.  By day two, the patient had progressed with therapy and meeting their goals.  Incision was healing well.  Patient was seen in rounds and was ready to go home.   Diet: Diabetic diet Activity:WBAT Follow-up:in 2 weeks Disposition - Home Discharged Condition: stable   Discharge Instructions   Call MD / Call 911    Complete by:  As directed   If you experience chest pain or shortness of breath, CALL 911 and be transported to the hospital emergency room.  If you develope a fever above 101 F, pus (white drainage) or increased drainage or redness at the wound, or calf pain, call your surgeon's office.     Constipation Prevention    Complete by:  As directed   Drink plenty of fluids.  Prune juice may be helpful.  You may use a stool softener, such as Colace (over the counter) 100 mg twice a day.  Use MiraLax (over the  counter) for constipation as needed.     Diet Carb Modified    Complete by:  As directed      Discharge instructions    Complete by:  As directed   Walk with your walker. Weight bearing as tolerated [place] will follow you at home for your therapy  Do not change your dressing over the incision unless there is excess drainage.  Change dressing over drain site as needed Shower only, no tub bath. Call if any temperatures greater than 101 or any wound complications: [phone] during the day and ask for Dr. Charlestine Night nurse, Brunilda Payor.     Do not put a pillow under the knee. Place it under the heel.    Complete by:  As directed      Driving restrictions    Complete by:  As directed   No driving for 2 weeks     Increase activity slowly as tolerated    Complete by:  As directed             Medication List    STOP taking these medications       aspirin 325 MG tablet      TAKE these medications       DSS 100 MG Caps  Take 100 mg by mouth 2 (two) times daily.     ferrous sulfate 325 (65 FE) MG tablet  Take 1 tablet (325 mg total) by mouth 3 (three) times daily after meals.     lisinopril-hydrochlorothiazide  20-25 MG per tablet  Commonly known as:  PRINZIDE,ZESTORETIC  Take 1 tablet by mouth daily before breakfast.     metFORMIN 1000 MG tablet  Commonly known as:  GLUCOPHAGE  Take 1,000 mg by mouth 2 (two) times daily with a meal.     methocarbamol 500 MG tablet  Commonly known as:  ROBAXIN  Take 1 tablet (500 mg total) by mouth every 6 (six) hours as needed for muscle spasms.     oxyCODONE 5 MG immediate release tablet  Commonly known as:  Oxy IR/ROXICODONE  Take 1-3 tablets (5-15 mg total) by mouth every 3 (three) hours as needed for severe pain.     polyvinyl alcohol 1.4 % ophthalmic solution  Commonly known as:  LIQUIFILM TEARS  Place 1 drop into both eyes 3 (three) times daily as needed for dry eyes.     rivaroxaban 10 MG Tabs tablet  Commonly known  as:  XARELTO  Take 1 tablet (10 mg total) by mouth daily with breakfast.     simvastatin 40 MG tablet  Commonly known as:  ZOCOR  Take 40 mg by mouth every evening.           Follow-up Information   Follow up with GIOFFRE,RONALD A, MD. Schedule an appointment as soon as possible for a visit in 2 weeks.   Specialty:  Orthopedic Surgery   Contact information:   [address] [place] [address] [phone]       Follow up with [facility]. (home health physical therapy)    Contact information:   [address] [place] [address] [phone]       Signed: Ardeen Jourdain, PA-C Orthopaedic Surgery 05/29/2014, 8:49 AM

## 2014-06-10 IMAGING — CR DG CHEST 2V
2 series · 2 of 2 positions shown · non-contrast
Comparison: None.

CLINICAL DATA: Hypertension.  Diabetes.  Preoperative for right
total knee replacement.

CHEST - 2 VIEW

[w chest pa]
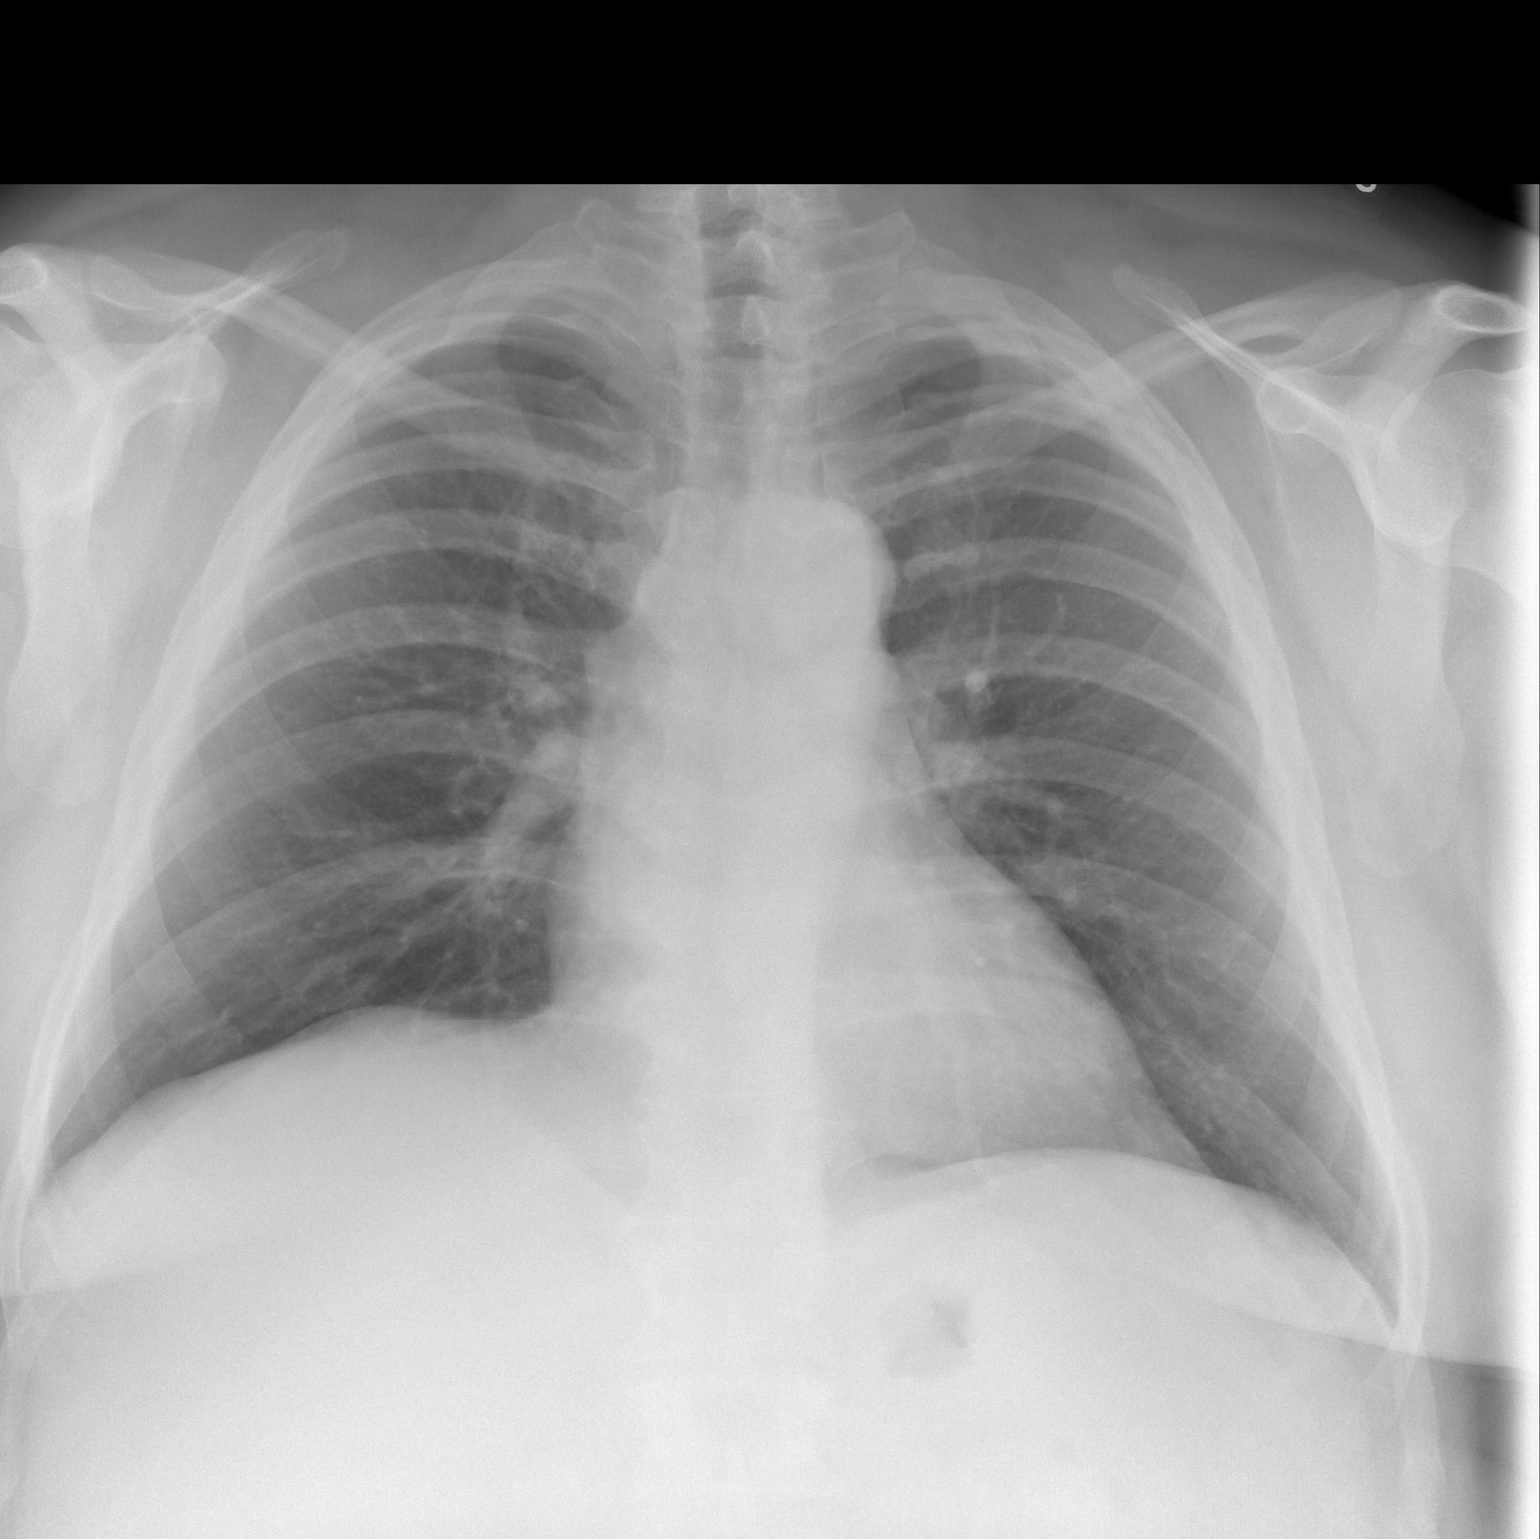

[w chest lat]
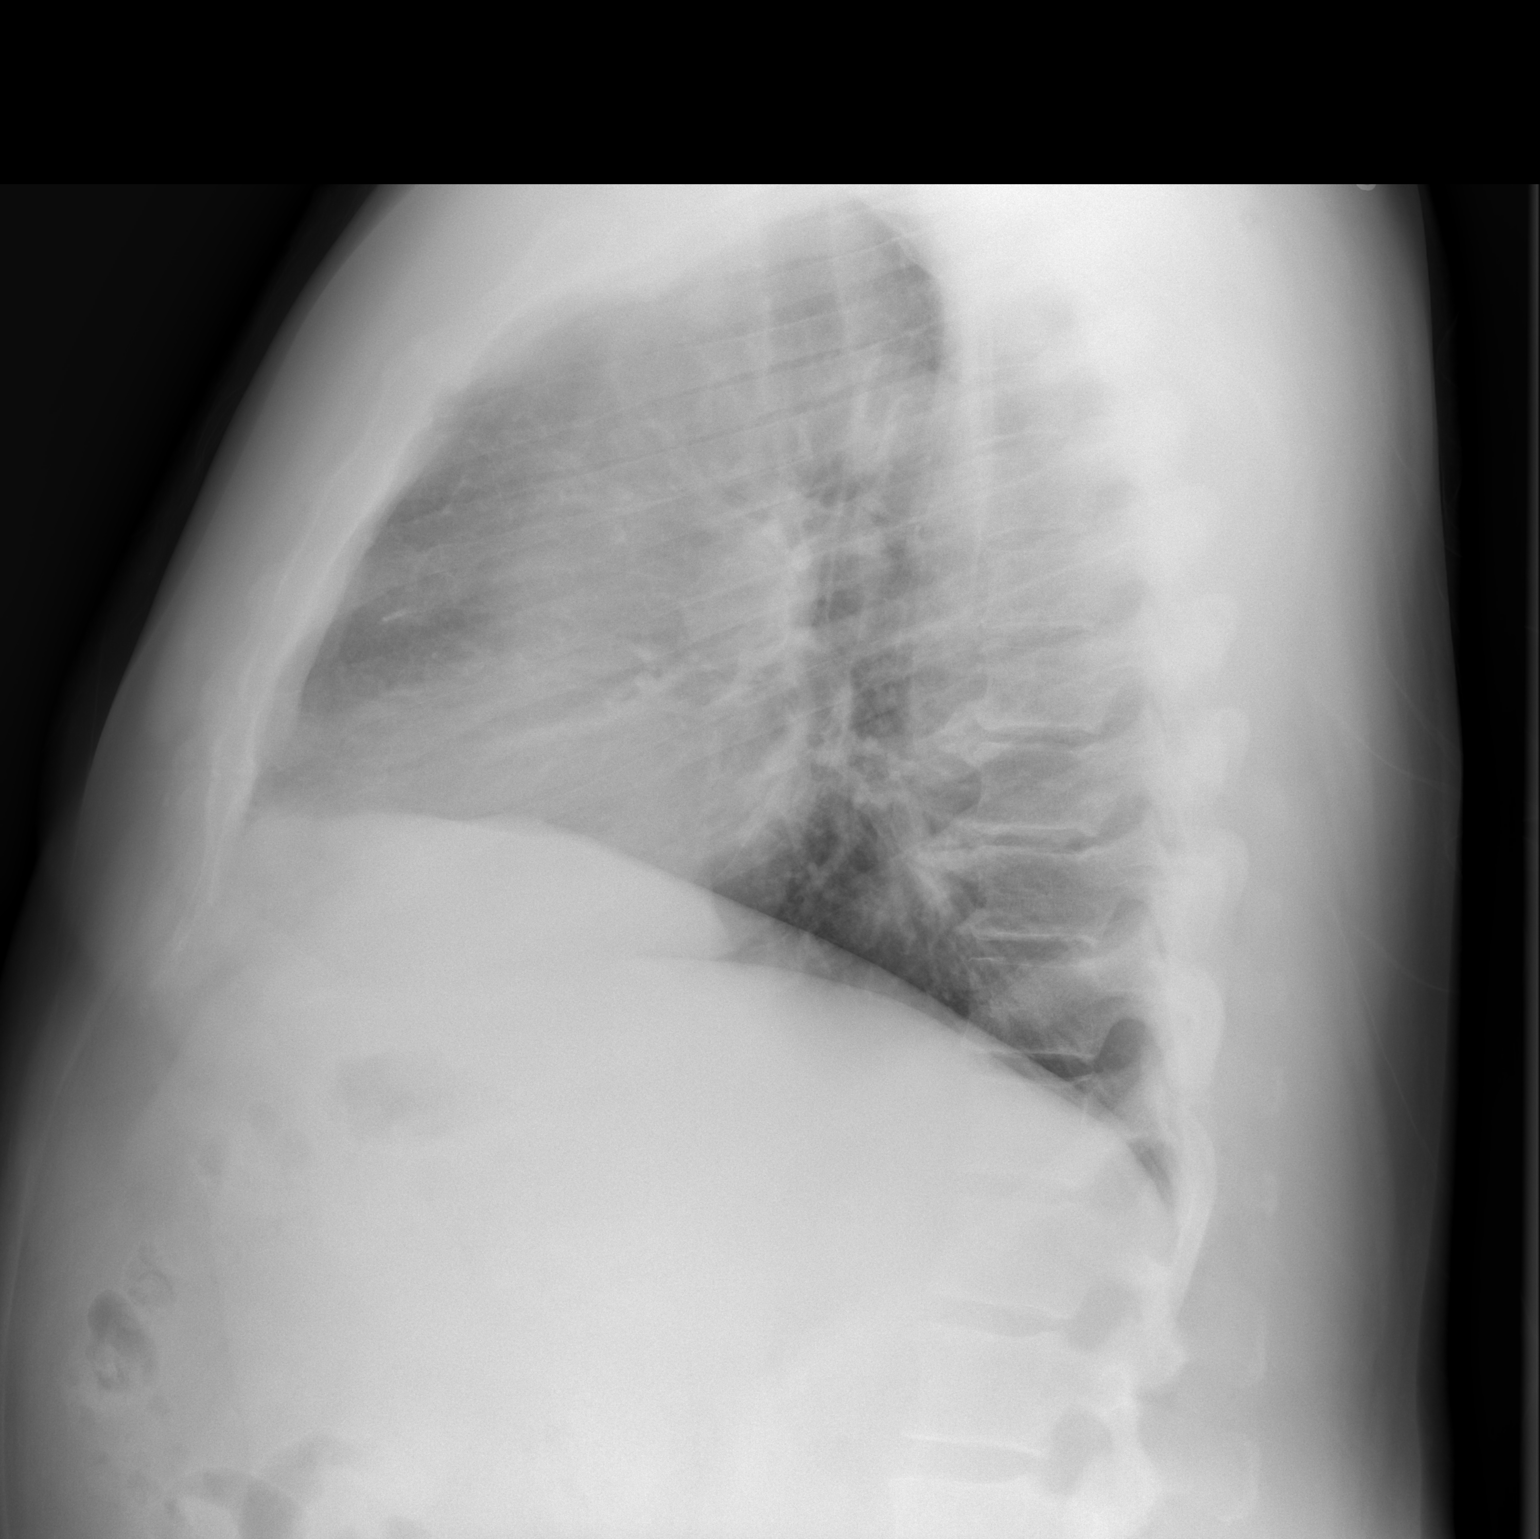

[2 of 2 positions shown; findings below may reference images not displayed]

FINDINGS: Cervical and thoracic spondylosis are observed. Cardiac
and mediastinal contours appear normal.

The lungs appear clear.

No pleural effusion is identified.
IMPRESSION: No significant abnormality identified.

## 2016-08-24 ENCOUNTER — Ambulatory Visit
Admission: EM | Admit: 2016-08-24 | Discharge: 2016-08-24 | Disposition: A | Discharge status: Home / Self Care | Payer: BC Managed Care – PPO | Attending: Family Medicine | Admitting: Family Medicine

## 2016-08-24 ENCOUNTER — Encounter: Payer: Self-pay | Admitting: Gynecology

## 2016-08-24 DIAGNOSIS — R69 Illness, unspecified: Secondary | ICD-10-CM | POA: Diagnosis not present

## 2016-08-24 DIAGNOSIS — R05 Cough: Secondary | ICD-10-CM

## 2016-08-24 DIAGNOSIS — J111 Influenza due to unidentified influenza virus with other respiratory manifestations: Secondary | ICD-10-CM

## 2016-08-24 DIAGNOSIS — R059 Cough, unspecified: Secondary | ICD-10-CM

## 2016-08-24 LAB — RAPID INFLUENZA A&B ANTIGENS
Influenza A (ARMC): NEGATIVE
Influenza B (ARMC): NEGATIVE

## 2016-08-24 LAB — RAPID STREP SCREEN (MED CTR MEBANE ONLY): STREPTOCOCCUS, GROUP A SCREEN (DIRECT): NEGATIVE

## 2016-08-24 MED ORDER — BENZONATATE 100 MG PO CAPS: 100.0000 mg | ORAL_CAPSULE | Freq: Three times a day (TID) | ORAL | 0 refills | Status: DC | PRN

## 2016-08-24 MED ORDER — AMOXICILLIN-POT CLAVULANATE 875-125 MG PO TABS: 1.0000 | ORAL_TABLET | Freq: Two times a day (BID) | ORAL | 0 refills | Status: AC

## 2016-08-24 MED ORDER — ALBUTEROL SULFATE HFA 108 (90 BASE) MCG/ACT IN AERS: 2.0000 | INHALATION_SPRAY | Freq: Four times a day (QID) | RESPIRATORY_TRACT | 0 refills | Status: AC | PRN

## 2016-08-24 NOTE — ED Triage Notes (Signed)
Patient c/o sore throat / fever at home of 101.5 / cough x yesterday.

## 2016-08-24 NOTE — ED Provider Notes (Signed)
CSN: [id number]     Arrival date & time 08/24/16  1417 History   First MD Initiated Contact with Patient 08/24/16 1609     Chief Complaint  Patient presents with  . Sore Throat  . Fever   (Consider location/radiation/quality/duration/timing/severity/associated sxs/prior Treatment) 65 year old male presents with nasal congestion, fever, coughing and body aches since yesterday. Denies any GI symptoms. Has tried Copywriter, advertising, Mucinex and OTC cough syrup with minimal relief. Girlfriend in next exam room that was just treated for sinus infection and bronchitis (she has had symptoms for over 1 week). Has multiple chronic health issues and on multiple medication.    The history is provided by the patient.    Past Medical History:  Diagnosis Date  . Arthritis    knees- hx. gout  . Diabetes mellitus without complication ([place])   . History of gout   . Hyperlipidemia   . Hypertension   . Psoriasis    "one spot on my rt leg"   Past Surgical History:  Procedure Laterality Date  . TOTAL KNEE ARTHROPLASTY Right 12/08/2012   Procedure: TOTAL KNEE ARTHROPLASTY;  Surgeon: Tobi Bastos, MD;  Location: [facility];  Service: Orthopedics;  Laterality: Right;  . TOTAL KNEE ARTHROPLASTY Left 05/24/2014   Procedure: LEFT TOTAL KNEE ARTHROPLASTY;  Surgeon: Tobi Bastos, MD;  Location: [facility];  Service: Orthopedics;  Laterality: Left;   No family history on file. Social History  Substance Use Topics  . Smoking status: Former Smoker    Types: Cigarettes    Quit date: 05/16/1994  . Smokeless tobacco: Former Systems developer    Quit date: 12/01/1988  . Alcohol use Yes     Comment: occ.    Review of Systems  Constitutional: Positive for chills, fatigue and fever. Negative for appetite change.  HENT: Positive for congestion, postnasal drip, sinus pain, sinus pressure and sore throat. Negative for ear pain.   Eyes: Negative for discharge.  Respiratory: Positive for cough and chest tightness. Negative for  wheezing.   Cardiovascular: Negative for chest pain.  Gastrointestinal: Negative for abdominal pain, diarrhea, nausea and vomiting.  Musculoskeletal: Positive for arthralgias. Negative for neck pain and neck stiffness.  Skin: Negative for rash.  Neurological: Positive for headaches. Negative for dizziness, syncope, weakness and light-headedness.  Hematological: Negative for adenopathy.    Allergies  Patient has no known allergies.  Home Medications   Prior to Admission medications   Medication Sig Start Date End Date Taking? Authorizing Provider  allopurinol (ZYLOPRIM) 100 MG tablet Take 100 mg by mouth daily.   Yes Historical Provider, MD  atorvastatin (LIPITOR) 20 MG tablet Take 40 mg by mouth daily.    Yes Historical Provider, MD  glipiZIDE (GLUCOTROL) 10 MG tablet Take 10 mg by mouth daily before breakfast.   Yes Historical Provider, MD  lisinopril-hydrochlorothiazide (PRINZIDE,ZESTORETIC) 20-25 MG per tablet Take 1 tablet by mouth daily before breakfast.   Yes Historical Provider, MD  metFORMIN (GLUCOPHAGE) 1000 MG tablet Take 1,000 mg by mouth 2 (two) times daily with a meal.   Yes Historical Provider, MD  polyvinyl alcohol (LIQUIFILM TEARS) 1.4 % ophthalmic solution Place 1 drop into both eyes 3 (three) times daily as needed for dry eyes.   Yes Historical Provider, MD  albuterol (PROVENTIL HFA;VENTOLIN HFA) 108 (90 Base) MCG/ACT inhaler Inhale 2 puffs into the lungs every 6 (six) hours as needed for wheezing or shortness of breath. 08/24/16   Katy Apo, NP  amoxicillin-clavulanate (AUGMENTIN) 875-125 MG tablet  Take 1 tablet by mouth every 12 (twelve) hours. 08/24/16 08/31/16  Katy Apo, NP  benzonatate (TESSALON) 100 MG capsule Take 1 capsule (100 mg total) by mouth 3 (three) times daily as needed for cough. 08/24/16   Katy Apo, NP  simvastatin (ZOCOR) 40 MG tablet Take 40 mg by mouth every evening.    Historical Provider, MD   Meds Ordered and Administered this  Visit  Medications - No data to display  BP 119/64 (BP Location: Left Arm)   Pulse (!) 106   Temp (!) 100.7 F (38.2 C) (Oral)   Resp 16   Ht 5\' 8"  (1.727 m)   Wt 300 lb (136.1 kg)   SpO2 98%   BMI 45.61 kg/m  No data found.   Physical Exam  Constitutional: He is oriented to person, place, and time. He appears well-developed and well-nourished. He appears ill. No distress.  HENT:  Head: Normocephalic and atraumatic.  Right Ear: Hearing, tympanic membrane, external ear and ear canal normal.  Left Ear: Hearing, tympanic membrane, external ear and ear canal normal.  Nose: Mucosal edema and rhinorrhea present. Right sinus exhibits no maxillary sinus tenderness and no frontal sinus tenderness. Left sinus exhibits no maxillary sinus tenderness and no frontal sinus tenderness.  Mouth/Throat: Uvula is midline and mucous membranes are normal. Posterior oropharyngeal erythema present.  Neck: Normal range of motion. Neck supple.  Cardiovascular: Regular rhythm and normal heart sounds.  Tachycardia present.   Pulmonary/Chest: Effort normal. He has decreased breath sounds in the right upper field and the left upper field. He has wheezes in the right upper field, the right lower field, the left upper field and the left lower field. He has no rhonchi. He has no rales.  Lymphadenopathy:    He has no cervical adenopathy.  Neurological: He is alert and oriented to person, place, and time.  Skin: Skin is warm and dry.  Psychiatric: He has a normal mood and affect. His behavior is normal. Judgment and thought content normal.    Urgent Care Course   Clinical Course     Procedures (including critical care time)  Labs Review Labs Reviewed  RAPID STREP SCREEN (NOT AT [facility])  RAPID INFLUENZA A&B ANTIGENS ([facility] ONLY)  CULTURE, GROUP A STREP [facility])    Imaging Review No results found.   Visual Acuity Review  Right Eye Distance:   Left Eye Distance:   Bilateral Distance:    Right Eye  Near:   Left Eye Near:    Bilateral Near:         MDM   1. Influenza-like illness   2. Cough    Reviewed negative rapid strep test and negative flu test with patient. Discussed that he probably has a flu-like illness. Recommend start Tessalon cough pills 1 every 8 hours as needed for cough. Use Albuterol inhaler 2 puffs every 6 hours as needed for wheezing and cough. Increase fluid intake to help loosen mucus. Rest. Take Ibuprofen 600mg  every 6 hours as needed for body aches and fever. If symptoms persist after 3 to 4 days, may start Augmentin 875mg  twice a day as directed- Rx written. Follow-up with his primary care provider in 1 week if not improving.     Katy Apo, NP 08/25/16 [phone]

## 2016-08-24 NOTE — Discharge Instructions (Signed)
Start Tessalon cough pills 1 every 8 hours as needed for cough. Recommend Albuterol 2 puffs every 6 hours as needed for cough. If symptoms continue or get worse after 3 more days, may start Augmentin twice a day as directed. Increase water intake to help loosen mucus. Follow-up with your primary care provider in 3 days if not improving.

## 2016-08-27 LAB — CULTURE, GROUP A STREP (THRC)

## 2016-09-04 ENCOUNTER — Telehealth: Payer: Self-pay | Admitting: *Deleted

## 2016-09-04 NOTE — Telephone Encounter (Signed)
Called patient, verified DOB, communicated negative strep culture result. Patient reported feeling much better. Advised patient to follow up with PCP or [facility] if symptoms return.

## 2016-10-06 HISTORY — PX: SKIN CANCER EXCISION: SHX779

## 2017-02-06 ENCOUNTER — Encounter: Payer: Self-pay | Admitting: Emergency Medicine

## 2017-02-06 ENCOUNTER — Emergency Department
Admission: EM | Admit: 2017-02-06 | Discharge: 2017-02-06 | Disposition: A | Discharge status: Home / Self Care | Payer: BC Managed Care – PPO | Attending: Emergency Medicine | Admitting: Emergency Medicine

## 2017-02-06 ENCOUNTER — Emergency Department: Payer: BC Managed Care – PPO

## 2017-02-06 DIAGNOSIS — Z87891 Personal history of nicotine dependence: Secondary | ICD-10-CM | POA: Diagnosis not present

## 2017-02-06 DIAGNOSIS — N10 Acute tubulo-interstitial nephritis: Secondary | ICD-10-CM | POA: Insufficient documentation

## 2017-02-06 DIAGNOSIS — R109 Unspecified abdominal pain: Secondary | ICD-10-CM | POA: Diagnosis present

## 2017-02-06 DIAGNOSIS — Z7984 Long term (current) use of oral hypoglycemic drugs: Secondary | ICD-10-CM | POA: Insufficient documentation

## 2017-02-06 DIAGNOSIS — I10 Essential (primary) hypertension: Secondary | ICD-10-CM | POA: Diagnosis not present

## 2017-02-06 DIAGNOSIS — R338 Other retention of urine: Secondary | ICD-10-CM

## 2017-02-06 DIAGNOSIS — E119 Type 2 diabetes mellitus without complications: Secondary | ICD-10-CM | POA: Insufficient documentation

## 2017-02-06 LAB — URINALYSIS, COMPLETE (UACMP) WITH MICROSCOPIC
BILIRUBIN URINE: NEGATIVE
GLUCOSE, UA: NEGATIVE mg/dL
HGB URINE DIPSTICK: NEGATIVE
Ketones, ur: NEGATIVE mg/dL
Nitrite: NEGATIVE
PH: 5 (ref 5.0–8.0)
Protein, ur: NEGATIVE mg/dL
Specific Gravity, Urine: 1.019 (ref 1.005–1.030)

## 2017-02-06 LAB — COMPREHENSIVE METABOLIC PANEL
ALK PHOS: 56 U/L (ref 38–126)
ALT: 16 U/L — ABNORMAL LOW (ref 17–63)
ANION GAP: 9 (ref 5–15)
AST: 29 U/L (ref 15–41)
Albumin: 4 g/dL (ref 3.5–5.0)
BUN: 25 mg/dL — ABNORMAL HIGH (ref 6–20)
CALCIUM: 9.2 mg/dL (ref 8.9–10.3)
CO2: 27 mmol/L (ref 22–32)
Chloride: 97 mmol/L — ABNORMAL LOW (ref 101–111)
Creatinine, Ser: 1.31 mg/dL — ABNORMAL HIGH (ref 0.61–1.24)
GFR, EST NON AFRICAN AMERICAN: 56 mL/min — AB (ref >60)
Glucose, Bld: 131 mg/dL — ABNORMAL HIGH (ref 65–99)
Potassium: 3.7 mmol/L (ref 3.5–5.1)
SODIUM: 133 mmol/L — AB (ref 135–145)
Total Bilirubin: 1.5 mg/dL — ABNORMAL HIGH (ref 0.3–1.2)
Total Protein: 7.3 g/dL (ref 6.5–8.1)

## 2017-02-06 LAB — CBC WITH DIFFERENTIAL/PLATELET
Basophils Absolute: 0 10*3/uL (ref 0–0.1)
Basophils Relative: 0 %
EOS ABS: 0.3 10*3/uL (ref 0–0.7)
EOS PCT: 2 %
HCT: 42.1 % (ref 40.0–52.0)
Hemoglobin: 14.6 g/dL (ref 13.0–18.0)
LYMPHS ABS: 1.3 10*3/uL (ref 1.0–3.6)
Lymphocytes Relative: 9 %
MCH: 31.7 pg (ref 26.0–34.0)
MCHC: 34.8 g/dL (ref 32.0–36.0)
MCV: 91.3 fL (ref 80.0–100.0)
MONOS PCT: 8 %
Monocytes Absolute: 1.1 10*3/uL — ABNORMAL HIGH (ref 0.2–1.0)
Neutro Abs: 12.1 10*3/uL — ABNORMAL HIGH (ref 1.4–6.5)
Neutrophils Relative %: 81 %
PLATELETS: 232 10*3/uL (ref 150–440)
RBC: 4.61 MIL/uL (ref 4.40–5.90)
RDW: 13.7 % (ref 11.5–14.5)
WBC: 14.9 10*3/uL — ABNORMAL HIGH (ref 3.8–10.6)

## 2017-02-06 MED ORDER — CEFTRIAXONE SODIUM IN DEXTROSE 20 MG/ML IV SOLN
1.0000 g | Freq: Once | INTRAVENOUS | Status: AC
  Administered 2017-02-06: 1 g via INTRAVENOUS
  Filled 2017-02-06: qty 50

## 2017-02-06 MED ORDER — CEPHALEXIN 500 MG PO CAPS: 500.0000 mg | ORAL_CAPSULE | Freq: Two times a day (BID) | ORAL | 0 refills | Status: AC

## 2017-02-06 MED ORDER — SODIUM CHLORIDE 0.9 % IV BOLUS (SEPSIS)
1000.0000 mL | Freq: Once | INTRAVENOUS | Status: AC
  Administered 2017-02-06: 1000 mL via INTRAVENOUS

## 2017-02-06 NOTE — ED Provider Notes (Signed)
[facility] Emergency Department Provider Note   ____________________________________________   I have reviewed the triage vital signs and the nursing notes.   HISTORY  Chief Complaint Urinary Retention    HPI [name] [name] is a 66 y.o. male presents with urinary retention x 1 day. Pt describes progressive difficulty passing urine through the day. through the day. He states during the night developing a fever,103F, and with Tylenol the fever resolved. He denies blood in the urine, abdominal pain, N/V/D, bowel dysfunction, headache, chest pain. He endorses left side flank pain.    Past Medical History:  Diagnosis Date  . Arthritis    knees- hx. gout  . Diabetes mellitus without complication ([place])   . History of gout   . Hyperlipidemia   . Hypertension   . Psoriasis    "one spot on my rt leg"    Patient Active Problem List   Diagnosis Date Noted  . Osteoarthritis of left knee 05/24/2014  . Total knee replacement status 05/24/2014  . Postoperative anemia due to acute blood loss 12/10/2012  . Osteoarthritis of left hip 12/08/2012    Past Surgical History:  Procedure Laterality Date  . TOTAL KNEE ARTHROPLASTY Right 12/08/2012   Procedure: TOTAL KNEE ARTHROPLASTY;  Surgeon: Tobi Bastos, MD;  Location: [facility];  Service: Orthopedics;  Laterality: Right;  . TOTAL KNEE ARTHROPLASTY Left 05/24/2014   Procedure: LEFT TOTAL KNEE ARTHROPLASTY;  Surgeon: Tobi Bastos, MD;  Location: [facility];  Service: Orthopedics;  Laterality: Left;    Prior to Admission medications   Medication Sig Start Date End Date Taking? Authorizing Provider  albuterol (PROVENTIL HFA;VENTOLIN HFA) 108 (90 Base) MCG/ACT inhaler Inhale 2 puffs into the lungs every 6 (six) hours as needed for wheezing or shortness of breath. 08/24/16   Katy Apo, NP  allopurinol (ZYLOPRIM) 100 MG tablet Take 100 mg by mouth daily.    Historical Provider, MD  atorvastatin (LIPITOR) 20 MG  tablet Take 40 mg by mouth daily.     Historical Provider, MD  benzonatate (TESSALON) 100 MG capsule Take 1 capsule (100 mg total) by mouth 3 (three) times daily as needed for cough. 08/24/16   Katy Apo, NP  cephALEXin (KEFLEX) 500 MG capsule Take 1 capsule (500 mg total) by mouth 2 (two) times daily. 02/06/17 02/16/17  Keldan Eplin M Patrece Tallie, PA-C  glipiZIDE (GLUCOTROL) 10 MG tablet Take 10 mg by mouth daily before breakfast.    Historical Provider, MD  lisinopril-hydrochlorothiazide (PRINZIDE,ZESTORETIC) 20-25 MG per tablet Take 1 tablet by mouth daily before breakfast.    Historical Provider, MD  metFORMIN (GLUCOPHAGE) 1000 MG tablet Take 1,000 mg by mouth 2 (two) times daily with a meal.    Historical Provider, MD  polyvinyl alcohol (LIQUIFILM TEARS) 1.4 % ophthalmic solution Place 1 drop into both eyes 3 (three) times daily as needed for dry eyes.    Historical Provider, MD  simvastatin (ZOCOR) 40 MG tablet Take 40 mg by mouth every evening.    Historical Provider, MD    Allergies Patient has no known allergies.  History reviewed. No pertinent family history.  Social History Social History  Substance Use Topics  . Smoking status: Former Smoker    Types: Cigarettes    Quit date: 05/16/1994  . Smokeless tobacco: Former Systems developer    Quit date: 12/01/1988  . Alcohol use Yes     Comment: occ.    Review of Systems Constitutional: No fever/chills Eyes: No visual changes. ENT:  No sore throat. Cardiovascular: Denies chest pain. Respiratory: Denies cough Gastrointestinal: No abdominal pain.  No nausea, no vomiting.   Genitourinary: Urinary retention, left side flank pain Musculoskeletal: negative for back pain Skin: Negative for rash. Neurological: Negative for headaches  ____________________________________________   PHYSICAL EXAM:  VITAL SIGNS: ED Triage Vitals  Enc Vitals Group     BP 02/06/17 1721 112/70     Pulse Rate 02/06/17 1721 89     Resp 02/06/17 1721 16     Temp 02/06/17  1721 97.7 F (36.5 C)     Temp Source 02/06/17 1721 Oral     SpO2 02/06/17 1721 98 %     Weight 02/06/17 1707 300 lb (136.1 kg)     Height 02/06/17 1707 5\' 8"  (1.727 m)     Head Circumference --      Peak Flow --      Pain Score 02/06/17 1706 0     Pain Loc --      Pain Edu? --      Excl. in [place]? --     Constitutional: Alert and oriented. Well appearing and in no acute distress. Eyes: Conjunctivae are normal. Head: Atraumatic. Nose: No congestion/rhinnorhea. Mouth/Throat: Mucous membranes are moist.  Cardiovascular: Normal rate, regular rhythm.  Good peripheral circulation. Respiratory: Normal respiratory effort.  No retractions.  Gastrointestinal: Abdomen soft, non-tender. BS present all quadrants Genitourinary: Urinary retention, flank pain Musculoskeletal: Strength, ROM sensation all extremities intact. No joint effusion. Neurologic:  Normal speech and language. No gross focal neurologic deficits are appreciated.  Skin:  Skin is warm, dry and intact. No rash noted. Psychiatric: Mood and affect are normal. Speech and behavior are normal.  ____________________________________________   LABS (all labs ordered are listed, but only abnormal results are displayed)  Labs Reviewed  URINALYSIS, COMPLETE (UACMP) WITH MICROSCOPIC - Abnormal; Notable for the following:       Result Value   Color, Urine AMBER (*)    APPearance HAZY (*)    Leukocytes, UA MODERATE (*)    Bacteria, UA RARE (*)    Squamous Epithelial / LPF 0-5 (*)    All other components within normal limits  CBC WITH DIFFERENTIAL/PLATELET - Abnormal; Notable for the following:    WBC 14.9 (*)    Neutro Abs 12.1 (*)    Monocytes Absolute 1.1 (*)    All other components within normal limits  COMPREHENSIVE METABOLIC PANEL - Abnormal; Notable for the following:    Sodium 133 (*)    Chloride 97 (*)    Glucose, Bld 131 (*)    BUN 25 (*)    Creatinine, Ser 1.31 (*)    ALT 16 (*)    Total Bilirubin 1.5 (*)    GFR  calc non Af Amer 56 (*)    All other components within normal limits  URINE CULTURE   ____________________________________________  EKG none  ____________________________________________  RADIOLOGY CT renal stone study IMPRESSION: 1. No cause for urinary retention identified. 2. Atherosclerotic changes. 3. No other acute abnormalities. ____________________________________________   PROCEDURES  Procedure(s) performed: no   Critical Care performed: no ____________________________________________   INITIAL IMPRESSION / ASSESSMENT AND PLAN / ED COURSE  Pertinent labs & imaging results that were available during my care of the patient were reviewed by me and considered in my medical decision making (see chart for details).  Patient presentation consistent with diagnosis of acute pylonephritis vs.cystis. Physical exam, labs and imaging confirmed diagnosis. Urinary retention relieved by foley catheter placement, initial  antibiotic initiated via IV ceftriaxone. Patient will be discharged with foley catheter and keflex PO. Foley will remain in place until Urology consult. He will be referred to Urology for follow up. Patient instructed to return to ED if symptoms worsen.       ____________________________________________   FINAL CLINICAL IMPRESSION(S) / ED DIAGNOSES  Final diagnoses:  Acute pyelonephritis  Acute urinary retention      NEW MEDICATIONS STARTED DURING THIS VISIT:  New Prescriptions   CEPHALEXIN (KEFLEX) 500 MG CAPSULE    Take 1 capsule (500 mg total) by mouth 2 (two) times daily.     Note:  This document was prepared using Dragon voice recognition software and may include unintentional dictation errors.   Laroy Apple Tarnesha Ulloa, PA-C 02/07/17 [id number]    Delman Kitten, MD 02/07/17 [phone]

## 2017-02-06 NOTE — ED Triage Notes (Signed)
Pt to ed with c/o urinary retention today, pt states he is only able to void a few drops at a time.  Pt also reports fever yesterday and urinary frequency and incontinence.

## 2017-02-06 NOTE — ED Notes (Signed)
Patient transported to CT 

## 2017-02-06 NOTE — Discharge Instructions (Signed)
Take medication as prescribed. Return to emergency department if symptoms worsen and follow-up with PCP as needed.   Call to make urology appointment on Monday.

## 2017-02-06 NOTE — ED Notes (Signed)
Catheter changed to leg bag and given instructions on how to switch bags for bedtime use and how to care for.  Patient and family have no further questions.

## 2017-02-09 LAB — URINE CULTURE

## 2017-02-16 NOTE — Progress Notes (Signed)
02/18/2017 11:18 AM   Janine Ores December 11, 1950 [id number]  Referring provider: [place], [address] [place], [place] [address]  Chief Complaint  Patient presents with  . New Patient (Initial Visit)    uti referred by ER  patient has cath     HPI: Patient is a 66 yo WM who was seen in the [facility] ED for urinary retention and pyelonephrosis with his girlfriend, [name].    He presented to the ED with urinary retention x 1 day. Pt describes progressive difficulty passing urine through the day. through the day. He states during the night developing a fever,103F, and with Tylenol the fever resolved. He denies blood in the urine, abdominal pain, N/V/D, bowel dysfunction, headache, chest pain. He endorses left side flank pain.   CT Renal stone study performed on 02/06/2017 noted no cause for urinary retention identified.  Atherosclerotic changes.  No other acute abnormalities.  I have independently reviewed the films.  He had a Foley placed in the ED and he does not recall the amount of urine that was returned.  I cannot find any documentation of the PVR on bladder scan.  Patient states that a bladder scan was performed.    Today, he is not having fevers, chills, nausea and vomiting.  He is having intermittent gross hematuria after activity.  Otherwise, his urine was clear.    He states his baseline urinary symptoms consists of frequency, urgency, nocturia, intermittency, hesitancy and straining to urinate. Patient states that he does drink a lot of liquids including beer.  He states he does not have a history of prostate issues.     PMH: Past Medical History:  Diagnosis Date  . Arthritis    knees- hx. gout  . Diabetes mellitus without complication ([place])   . History of gout   . Hyperlipidemia   . Hypertension   . Psoriasis    "one spot on my rt leg"  . Skin cancer     Surgical History: Past Surgical History:  Procedure Laterality Date  . SKIN CANCER EXCISION   2018   basal cell  . TOTAL KNEE ARTHROPLASTY Right 12/08/2012   Procedure: TOTAL KNEE ARTHROPLASTY;  Surgeon: Tobi Bastos, MD;  Location: [facility];  Service: Orthopedics;  Laterality: Right;  . TOTAL KNEE ARTHROPLASTY Left 05/24/2014   Procedure: LEFT TOTAL KNEE ARTHROPLASTY;  Surgeon: Tobi Bastos, MD;  Location: [facility];  Service: Orthopedics;  Laterality: Left;    Home Medications:  Allergies as of 02/18/2017   No Known Allergies     Medication List       Accurate as of 02/18/17 11:18 AM. Always use your most recent med list.          Acetaminophen 500 MG coapsule Take by mouth.   albuterol 108 (90 Base) MCG/ACT inhaler Commonly known as:  PROVENTIL HFA;VENTOLIN HFA Inhale 2 puffs into the lungs every 6 (six) hours as needed for wheezing or shortness of breath.   allopurinol 100 MG tablet Commonly known as:  ZYLOPRIM Take 100 mg by mouth daily.   ASPIRIN 81 PO Take by mouth.   atorvastatin 20 MG tablet Commonly known as:  LIPITOR Take 40 mg by mouth daily.   benzonatate 100 MG capsule Commonly known as:  TESSALON Take 1 capsule (100 mg total) by mouth 3 (three) times daily as needed for cough.   glipiZIDE 10 MG tablet Commonly known as:  GLUCOTROL Take 10 mg by mouth daily before  breakfast.   lisinopril-hydrochlorothiazide 20-25 MG tablet Commonly known as:  PRINZIDE,ZESTORETIC Take 1 tablet by mouth daily before breakfast.   metFORMIN 1000 MG tablet Commonly known as:  GLUCOPHAGE Take 2,000 mg by mouth daily. Extended release   polyvinyl alcohol 1.4 % ophthalmic solution Commonly known as:  LIQUIFILM TEARS Place 1 drop into both eyes 3 (three) times daily as needed for dry eyes.   sildenafil 20 MG tablet Commonly known as:  REVATIO May use 1-5 tabs daily 1/2 hr before intercourse   simvastatin 40 MG tablet Commonly known as:  ZOCOR Take 40 mg by mouth every evening.       Allergies: No Known Allergies  Family History: Family History    Problem Relation Age of Onset  . Kidney cancer Neg Hx   . Prostate cancer Neg Hx   . Bladder Cancer Neg Hx     Social History:  reports that he quit smoking about 22 years ago. His smoking use included Cigarettes. He quit smokeless tobacco use about 28 years ago. He reports that he drinks alcohol. He reports that he does not use drugs.  ROS: UROLOGY Frequent Urination?: Yes Hard to postpone urination?: Yes Burning/pain with urination?: No Get up at night to urinate?: Yes Leakage of urine?: No Urine stream starts and stops?: Yes Trouble starting stream?: Yes Do you have to strain to urinate?: Yes Blood in urine?: Yes Urinary tract infection?: No Sexually transmitted disease?: No Injury to kidneys or bladder?: No Painful intercourse?: No Weak stream?: No Erection problems?: Yes Penile pain?: No  Gastrointestinal Nausea?: No Vomiting?: No Indigestion/heartburn?: No Diarrhea?: Yes Constipation?: No  Constitutional Fever: Yes Night sweats?: Yes Weight loss?: No Fatigue?: Yes  Skin Skin rash/lesions?: No Itching?: Yes  Eyes Blurred vision?: No Double vision?: No  Ears/Nose/Throat Sore throat?: No Sinus problems?: No  Hematologic/Lymphatic Swollen glands?: No Easy bruising?: No  Cardiovascular Leg swelling?: Yes Chest pain?: No  Respiratory Cough?: Yes Shortness of breath?: Yes  Endocrine Excessive thirst?: No  Musculoskeletal Back pain?: No Joint pain?: Yes  Neurological Headaches?: No Dizziness?: No  Psychologic Depression?: No Anxiety?: No  Physical Exam: BP 135/84   Pulse 90   Temp 97.9 F (36.6 C) (Oral)   Ht 5\' 8"  (1.727 m)   Wt (!) 302 lb (137 kg)   BMI 45.92 kg/m   Constitutional: Well nourished. Alert and oriented, No acute distress. HEENT: [place] AT, moist mucus membranes. Trachea midline, no masses. Cardiovascular: No clubbing, cyanosis, or edema. Respiratory: Normal respiratory effort, no increased work of breathing. GI:  Abdomen is soft, non tender, non distended, no abdominal masses. Liver and spleen not palpable.  No hernias appreciated.  Stool sample for occult testing is not indicated.   GU: No CVA tenderness.  No bladder fullness or masses.  Patient with circumcised phallus.   Urethral meatus is patent.  No penile discharge. No penile lesions or rashes. Scrotum without lesions, cysts, rashes and/or edema.  Testicles are located scrotally bilaterally. No masses are appreciated in the testicles. Left and right epididymis are normal. Rectal: Patient with  normal sphincter tone. Anus and perineum without scarring or rashes. No rectal masses are appreciated. Prostate DRE limited to patient's body habitus.   Skin: No rashes, bruises or suspicious lesions. Lymph: No cervical or inguinal adenopathy. Neurologic: Grossly intact, no focal deficits, moving all 4 extremities. Psychiatric: Normal mood and affect.  Laboratory Data: PSA History  0.89 ng/mL on 04/21/2016  Lab Results  Component Value Date   WBC  14.9 (H) 02/06/2017   HGB 14.6 02/06/2017   HCT 42.1 02/06/2017   MCV 91.3 02/06/2017   PLT 232 02/06/2017    Lab Results  Component Value Date   CREATININE 1.31 (H) 02/06/2017    Lab Results  Component Value Date   AST 29 02/06/2017   Lab Results  Component Value Date   ALT 16 (L) 02/06/2017    Pertinent Imaging: CLINICAL DATA:  Urinary retention.  EXAM: CT ABDOMEN AND PELVIS WITHOUT CONTRAST  TECHNIQUE: Multidetector CT imaging of the abdomen and pelvis was performed following the standard protocol without IV contrast.  COMPARISON:  None.  FINDINGS: Lower chest: No acute abnormality.  Hepatobiliary: No focal liver abnormality is seen. No gallstones, gallbladder wall thickening, or biliary dilatation.  Pancreas: Unremarkable. No pancreatic ductal dilatation or surrounding inflammatory changes.  Spleen: Normal in size without focal abnormality.  Adrenals/Urinary Tract:  There is a small cyst in the upper left kidney. No other renal masses. No stones, hydronephrosis, perinephric stranding, ureterectasis, or ureteral stone. The bladder is unremarkable. The adrenal glands are unremarkable as well. 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  Stomach/Bowel: A lipoma seen in the third portion of the duodenum. Small bowel is otherwise normal. Colonic diverticulosis is seen without diverticulitis. The appendix is normal.  Vascular/Lymphatic: Atherosclerotic changes seen in the non aneurysmal abdominal aorta and iliac vessels. No adenopathy.  Reproductive: Prostate is unremarkable.  Other: A fat containing umbilical hernia is identified. No free air or free fluid.  Musculoskeletal: No acute or significant osseous findings.  IMPRESSION: 1. No cause for urinary retention identified. 2. Atherosclerotic changes. 3. No other acute abnormalities.   Electronically Signed   By: Dorise Bullion III M.D   On: 02/06/2017 19:17  Assessment & Plan:    1. Acute urinary retention:     - foley catheter removed  - voiding trial today    - return if unable to urinate or experiencing suprapubic discomfort  - follow-up in one month for I PSS score and PVR  2. Pyelonephritis  - asymptomatic at this time  - rechecking creatinine and CBC today   Return in about 1 month (around 03/21/2017) for IPSS and PVR.  These notes generated with voice recognition software. I apologize for typographical errors.  Zara Council, [facility] [address] [place], [place] [address] [phone]

## 2017-02-18 ENCOUNTER — Ambulatory Visit: Payer: BC Managed Care – PPO | Admitting: Urology

## 2017-02-18 ENCOUNTER — Encounter: Payer: Self-pay | Admitting: Urology

## 2017-02-18 VITALS — BP 135/84 | HR 90 | Temp 97.9°F | Ht 68.0 in | Wt 302.0 lb

## 2017-02-18 DIAGNOSIS — N12 Tubulo-interstitial nephritis, not specified as acute or chronic: Secondary | ICD-10-CM

## 2017-02-18 DIAGNOSIS — R339 Retention of urine, unspecified: Secondary | ICD-10-CM | POA: Diagnosis not present

## 2017-02-19 ENCOUNTER — Telehealth: Payer: Self-pay

## 2017-02-19 LAB — CBC WITH DIFFERENTIAL/PLATELET
BASOS: 0 %
Basophils Absolute: 0 10*3/uL (ref 0.0–0.2)
EOS (ABSOLUTE): 0.3 10*3/uL (ref 0.0–0.4)
Eos: 3 %
Hematocrit: 41.6 % (ref 37.5–51.0)
Hemoglobin: 13.9 g/dL (ref 13.0–17.7)
IMMATURE GRANULOCYTES: 0 %
Immature Grans (Abs): 0 10*3/uL (ref 0.0–0.1)
Lymphocytes Absolute: 2 10*3/uL (ref 0.7–3.1)
Lymphs: 17 %
MCH: 31 pg (ref 26.6–33.0)
MCHC: 33.4 g/dL (ref 31.5–35.7)
MCV: 93 fL (ref 79–97)
MONOS ABS: 0.5 10*3/uL (ref 0.1–0.9)
Monocytes: 4 %
NEUTROS PCT: 76 %
Neutrophils Absolute: 8.8 10*3/uL — ABNORMAL HIGH (ref 1.4–7.0)
PLATELETS: 281 10*3/uL (ref 150–379)
RBC: 4.49 x10E6/uL (ref 4.14–5.80)
RDW: 13.8 % (ref 12.3–15.4)
WBC: 11.6 10*3/uL — AB (ref 3.4–10.8)

## 2017-02-19 LAB — CREATININE, SERUM
CREATININE: 0.73 mg/dL — AB (ref 0.76–1.27)
GFR calc non Af Amer: 98 mL/min/{1.73_m2} (ref >59)
GFR, EST AFRICAN AMERICAN: 113 mL/min/{1.73_m2} (ref >59)

## 2017-02-19 NOTE — Telephone Encounter (Signed)
-----   Message from Nori Riis, PA-C sent at 02/19/2017  7:22 AM EDT ----- Please let the patient know that his labs are improved.  We will see him in one month.

## 2017-02-19 NOTE — Telephone Encounter (Signed)
Spoke with pt in reference to lab results and f/u. Pt voiced understanding.

## 2017-03-22 NOTE — Progress Notes (Signed)
03/23/2017 3:35 PM   [name] [name] January 05, 1951 [id number]  Referring provider: Valera Castle, [address] [place], [place] [address]  Chief Complaint  Patient presents with  . Urinary Retention    1 month follow up     HPI: 81 WM who presents today for a one month follow up.  Background history Patient is a 66 yo WM who was seen in the [facility] ED for urinary retention and pyelonephrosis with his girlfriend, [name] [name].  He presented to the ED with urinary retention x 1 day. Pt describes progressive difficulty passing urine through the day. through the day. He states during the night developing a fever,103F, and with Tylenol the fever resolved. He denies blood in the urine, abdominal pain, N/V/D, bowel dysfunction, headache, chest pain. He endorses left side flank pain.  CT Renal stone study performed on 02/06/2017 noted no cause for urinary retention identified.  Atherosclerotic changes.  No other acute abnormalities.  I have independently reviewed the films.  He had a Foley placed in the ED and he does not recall the amount of urine that was returned.  I cannot find any documentation of the PVR on bladder scan.  Patient states that a bladder scan was performed.     At his visit one month ago, he was not having fevers, chills, nausea and vomiting.  He was having intermittent gross hematuria after activity.  Otherwise, his urine was clear.  He states his baseline urinary symptoms consists of frequency, urgency, nocturia, intermittency, hesitancy and straining to urinate. Patient states that he does drink a lot of liquids including beer.  He states he does not have a history of prostate issues.   His Foley catheter was removed for a TOV.    His IPSS score today is 1, which is mild lower urinary tract symptomatology.  He is delighted with his quality life due to his urinary symptoms. His PVR is 0 mL.    His major complaint today is nocturia x 1.  He has had these symptoms for several  years.  He denies any dysuria, hematuria or suprapubic pain.      He also denies any recent fevers, chills, nausea or vomiting.  He does not have a family history of prostate cancer.       IPSS    Row Name 03/23/17 1500         International Prostate Symptom Score   How often have you had the sensation of not emptying your bladder? Not at All     How often have you had to urinate less than every two hours? Not at All     How often have you found you stopped and started again several times when you urinated? Not at All     How often have you found it difficult to postpone urination? Not at All     How often have you had a weak urinary stream? Not at All     How often have you had to strain to start urination? Not at All     How many times did you typically get up at night to urinate? 1 Time     Total IPSS Score 1       Quality of Life due to urinary symptoms   If you were to spend the rest of your life with your urinary condition just the way it is now how would you feel about that? Delighted  Score:  1-7 Mild 8-19 Moderate 20-35 Severe    PMH: Past Medical History:  Diagnosis Date  . Arthritis    knees- hx. gout  . Diabetes mellitus without complication ([place])   . History of gout   . Hyperlipidemia   . Hypertension   . Psoriasis    "one spot on my rt leg"  . Skin cancer     Surgical History: Past Surgical History:  Procedure Laterality Date  . SKIN CANCER EXCISION  2018   basal cell  . TOTAL KNEE ARTHROPLASTY Right 12/08/2012   Procedure: TOTAL KNEE ARTHROPLASTY;  Surgeon: Tobi Bastos, MD;  Location: [facility];  Service: Orthopedics;  Laterality: Right;  . TOTAL KNEE ARTHROPLASTY Left 05/24/2014   Procedure: LEFT TOTAL KNEE ARTHROPLASTY;  Surgeon: Tobi Bastos, MD;  Location: [facility];  Service: Orthopedics;  Laterality: Left;    Home Medications:  Allergies as of 03/23/2017   No Known Allergies     Medication List       Accurate as of 03/23/17   3:35 PM. Always use your most recent med list.          Acetaminophen 500 MG coapsule Take by mouth.   albuterol 108 (90 Base) MCG/ACT inhaler Commonly known as:  PROVENTIL HFA;VENTOLIN HFA Inhale 2 puffs into the lungs every 6 (six) hours as needed for wheezing or shortness of breath.   allopurinol 100 MG tablet Commonly known as:  ZYLOPRIM Take 100 mg by mouth daily.   ASPIRIN 81 PO Take by mouth.   atorvastatin 20 MG tablet Commonly known as:  LIPITOR Take 40 mg by mouth daily.   benzonatate 100 MG capsule Commonly known as:  TESSALON Take 1 capsule (100 mg total) by mouth 3 (three) times daily as needed for cough.   glipiZIDE 10 MG tablet Commonly known as:  GLUCOTROL Take 10 mg by mouth daily before breakfast.   lisinopril-hydrochlorothiazide 20-25 MG tablet Commonly known as:  PRINZIDE,ZESTORETIC Take 1 tablet by mouth daily before breakfast.   metFORMIN 1000 MG tablet Commonly known as:  GLUCOPHAGE Take 2,000 mg by mouth daily. Extended release   polyvinyl alcohol 1.4 % ophthalmic solution Commonly known as:  LIQUIFILM TEARS Place 1 drop into both eyes 3 (three) times daily as needed for dry eyes.   sildenafil 20 MG tablet Commonly known as:  REVATIO May use 1-5 tabs daily 1/2 hr before intercourse   simvastatin 40 MG tablet Commonly known as:  ZOCOR Take 40 mg by mouth every evening.       Allergies: No Known Allergies  Family History: Family History  Problem Relation Age of Onset  . Kidney cancer Neg Hx   . Prostate cancer Neg Hx   . Bladder Cancer Neg Hx     Social History:  reports that he quit smoking about 22 years ago. His smoking use included Cigarettes. He quit smokeless tobacco use about 28 years ago. He reports that he drinks alcohol. He reports that he does not use drugs.  ROS: UROLOGY Frequent Urination?: No Hard to postpone urination?: No Burning/pain with urination?: No Get up at night to urinate?: Yes Leakage of urine?:  No Urine stream starts and stops?: No Trouble starting stream?: No Do you have to strain to urinate?: No Blood in urine?: No Urinary tract infection?: No Sexually transmitted disease?: No Injury to kidneys or bladder?: No Painful intercourse?: No Weak stream?: No Erection problems?: No Penile pain?: No  Gastrointestinal Nausea?: No Vomiting?: No Indigestion/heartburn?: No  Diarrhea?: No Constipation?: No  Constitutional Fever: No Night sweats?: No Weight loss?: No Fatigue?: No  Skin Skin rash/lesions?: No Itching?: No  Eyes Blurred vision?: No Double vision?: No  Ears/Nose/Throat Sore throat?: No Sinus problems?: No  Hematologic/Lymphatic Swollen glands?: No Easy bruising?: No  Cardiovascular Leg swelling?: No Chest pain?: No  Respiratory Cough?: Yes Shortness of breath?: No  Endocrine Excessive thirst?: No  Musculoskeletal Back pain?: No Joint pain?: Yes  Neurological Headaches?: No Dizziness?: No  Psychologic Depression?: No Anxiety?: No  Physical Exam: BP (!) 144/93   Pulse 92   Ht 5\' 8"  (1.727 m)   Wt (!) 304 lb 6.4 oz (138.1 kg)   BMI 46.28 kg/m   Constitutional: Well nourished. Alert and oriented, No acute distress. HEENT: [place] AT, moist mucus membranes. Trachea midline, no masses. Cardiovascular: No clubbing, cyanosis, or edema. Respiratory: Normal respiratory effort, no increased work of breathing. GI: Abdomen is soft, non tender, non distended, no abdominal masses.  Skin: No rashes, bruises or suspicious lesions. Lymph: No cervical or inguinal adenopathy. Neurologic: Grossly intact, no focal deficits, moving all 4 extremities. Psychiatric: Normal mood and affect.  Laboratory Data: PSA History  0.89 ng/mL on 04/21/2016  Lab Results  Component Value Date   WBC 11.6 (H) 02/18/2017   HGB 13.9 02/18/2017   HCT 41.6 02/18/2017   MCV 93 02/18/2017   PLT 281 02/18/2017    Lab Results  Component Value Date   CREATININE  0.73 (L) 02/18/2017    Lab Results  Component Value Date   AST 29 02/06/2017   Lab Results  Component Value Date   ALT 16 (L) 02/06/2017    Pertinent Imaging: Results for ANGELINA, NEECE (MRN [id number]) as of 03/23/2017 15:30  Ref. Range 03/23/2017 15:26  Scan Result Unknown 116    Assessment & Plan:    1. History of urinary retention:     - TOV successful  2. BPH with LUTS  - IPSS score is 1/0  - Continue conservative management, avoiding bladder irritants and timed voiding's  - most bothersome symptoms is/are nocturia x 1  - RTC in 12 months for IPSS, PSA, PVR and exam    Return in about 1 year (around 03/23/2018) for IPSS, PSA and exam.  These notes generated with voice recognition software. I apologize for typographical errors.  Zara Council, [facility] [address], [place], [place] [address] [phone]

## 2017-03-23 ENCOUNTER — Encounter: Payer: Self-pay | Admitting: Urology

## 2017-03-23 ENCOUNTER — Ambulatory Visit (INDEPENDENT_AMBULATORY_CARE_PROVIDER_SITE_OTHER): Payer: BC Managed Care – PPO | Admitting: Urology

## 2017-03-23 VITALS — BP 144/93 | HR 92 | Ht 68.0 in | Wt 304.4 lb

## 2017-03-23 DIAGNOSIS — N138 Other obstructive and reflux uropathy: Secondary | ICD-10-CM | POA: Diagnosis not present

## 2017-03-23 DIAGNOSIS — N401 Enlarged prostate with lower urinary tract symptoms: Secondary | ICD-10-CM | POA: Diagnosis not present

## 2017-03-23 DIAGNOSIS — R339 Retention of urine, unspecified: Secondary | ICD-10-CM

## 2017-03-23 DIAGNOSIS — Z87898 Personal history of other specified conditions: Secondary | ICD-10-CM

## 2017-03-23 LAB — BLADDER SCAN AMB NON-IMAGING: Scan Result: 116

## 2018-03-18 ENCOUNTER — Other Ambulatory Visit: Payer: BC Managed Care – PPO

## 2018-03-23 ENCOUNTER — Ambulatory Visit: Payer: BC Managed Care – PPO | Admitting: Urology

## 2018-08-16 IMAGING — CT CT RENAL STONE PROTOCOL
2 of 4 series · 17 of 46 positions shown, 19 images · non-contrast
Comparison: None.

CLINICAL DATA: Urinary retention.

EXAM:
CT ABDOMEN AND PELVIS WITHOUT CONTRAST
TECHNIQUE: Multidetector CT imaging of the abdomen and pelvis was performed
following the standard protocol without IV contrast.

[Series 2: stone full standard · axial · 0.98mm/px · z∈[-136,+339]mm · 14 of 105 slices shown, 16 images]
[im 5/105  soft-tissue]
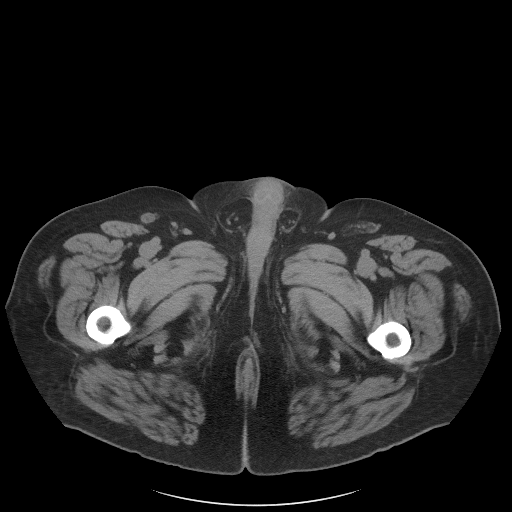
[im 5/105  bone]
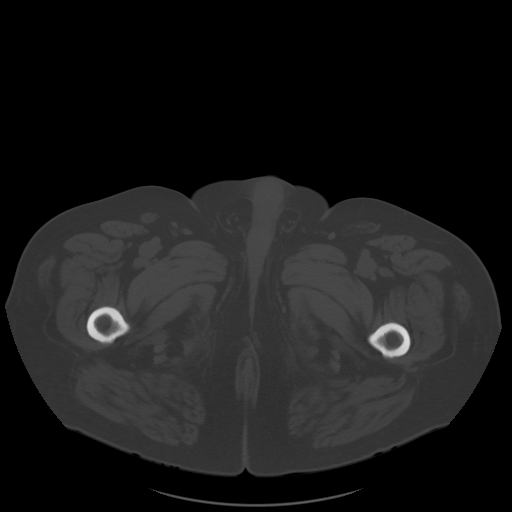
[im 14/105  soft-tissue]
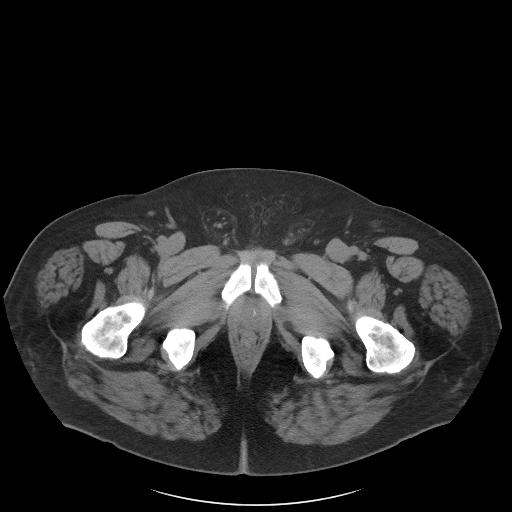
[im 22/105  soft-tissue]
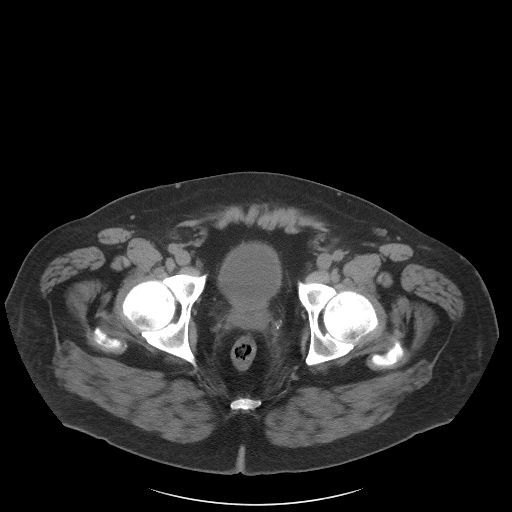
[im 27/105  soft-tissue]
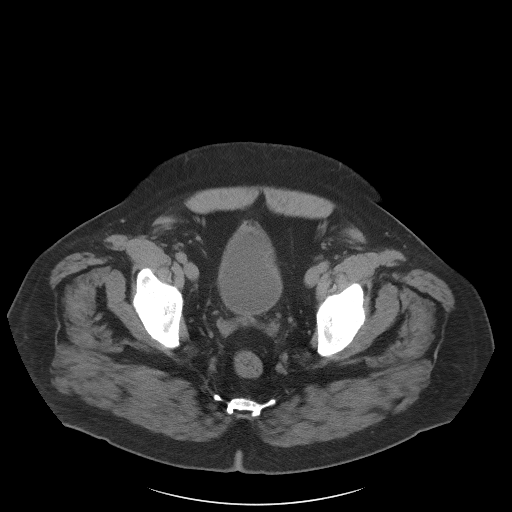
[im 35/105  soft-tissue]
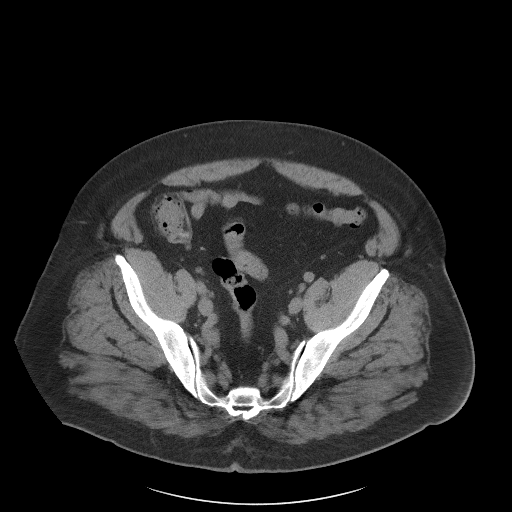
[im 44/105  soft-tissue]
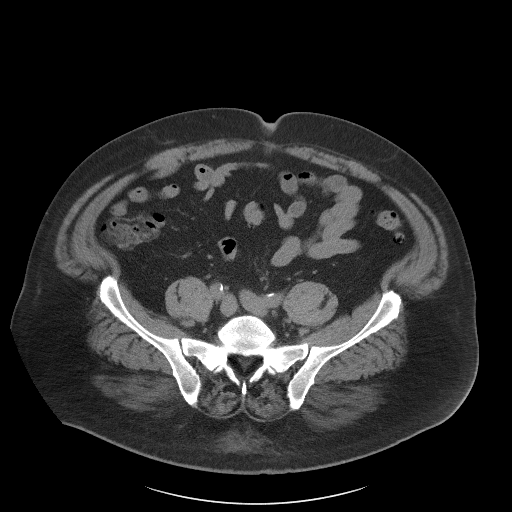
[im 48/105  soft-tissue]
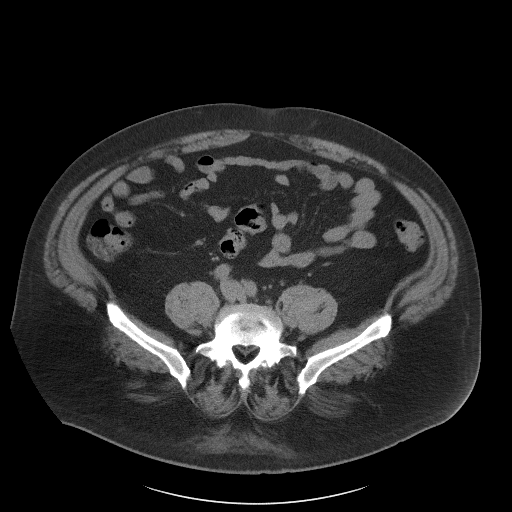
[im 57/105  soft-tissue]
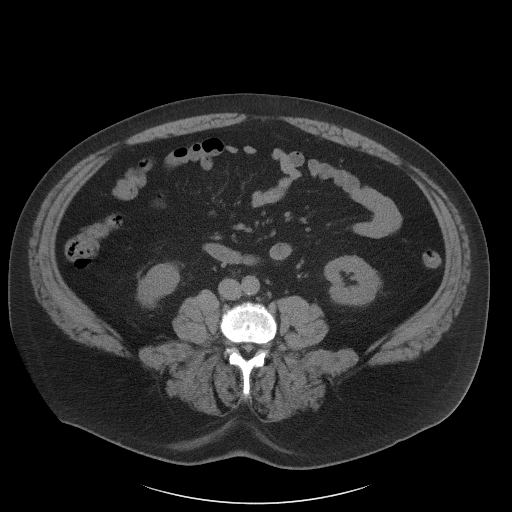
[im 61/105  soft-tissue]
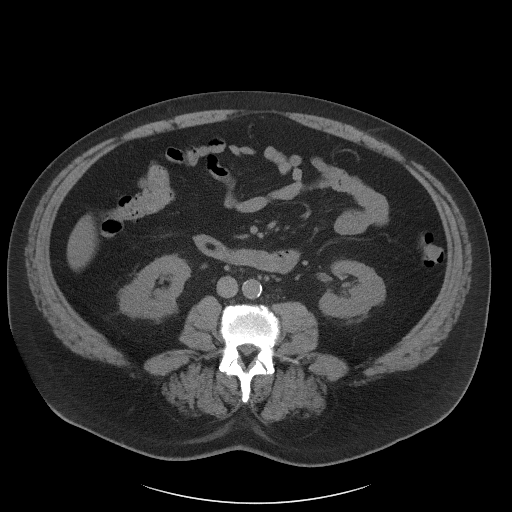
[im 61/105  bone]
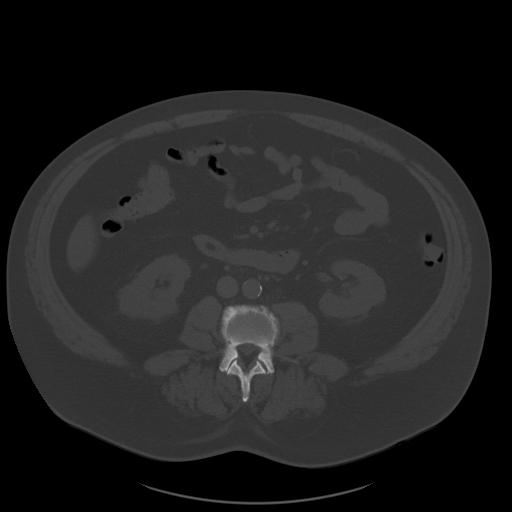
[im 70/105  soft-tissue]
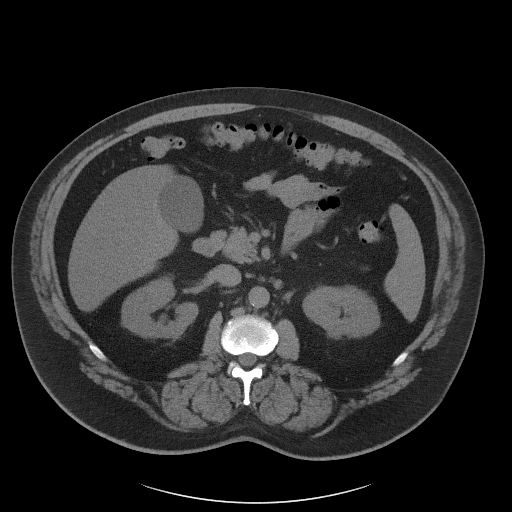
[im 79/105  soft-tissue]
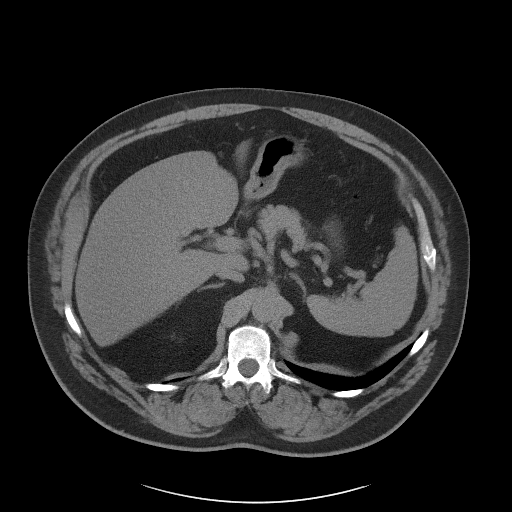
[im 83/105  soft-tissue]
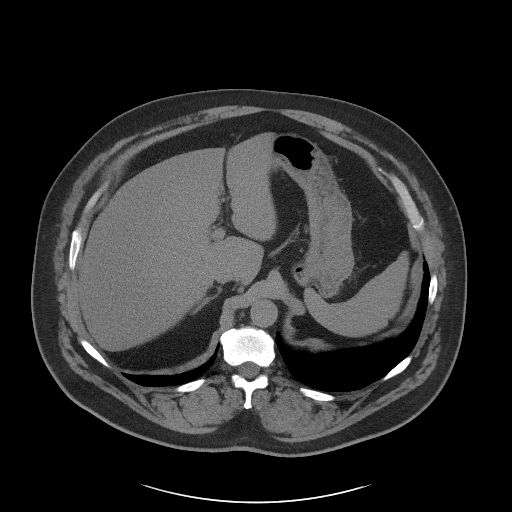
[im 92/105  soft-tissue]
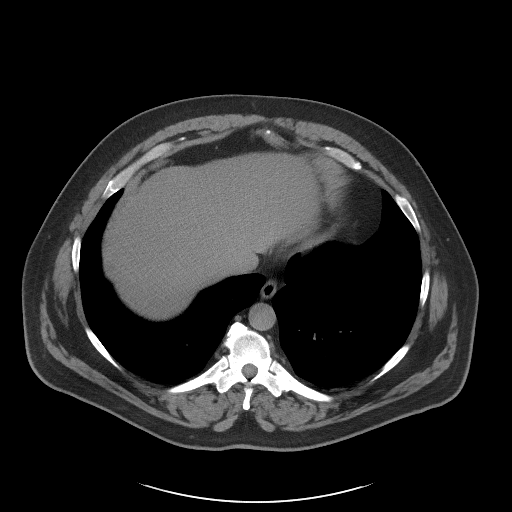
[im 100/105  soft-tissue]
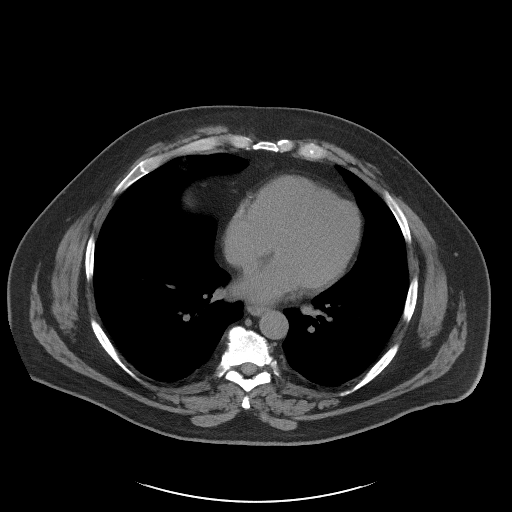

[Series 5: coronal · coronal · 0.93mm/px · 3 of 177 slices shown]
[im 59/177  soft-tissue]
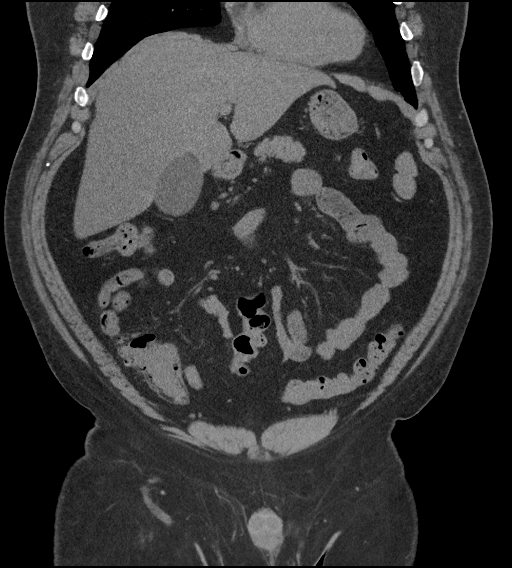
[im 79/177  soft-tissue]
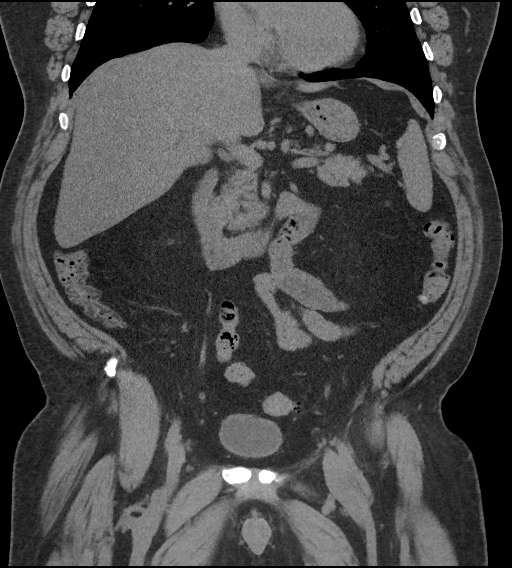
[im 98/177  soft-tissue]
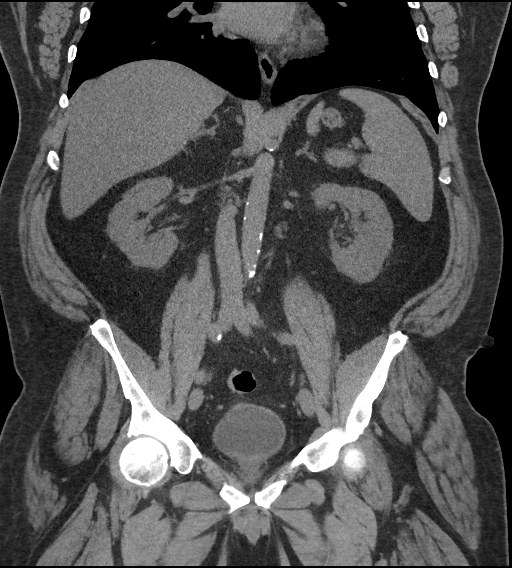

[17 of 46 positions shown; findings below may reference images not displayed]

FINDINGS: Lower chest: No acute abnormality.

Hepatobiliary: No focal liver abnormality is seen. No gallstones,
gallbladder wall thickening, or biliary dilatation.

Pancreas: Unremarkable. No pancreatic ductal dilatation or
surrounding inflammatory changes.

Spleen: Normal in size without focal abnormality.

Adrenals/Urinary Tract: There is a small cyst in the upper left
kidney. No other renal masses. No stones, hydronephrosis,
perinephric stranding, ureterectasis, or ureteral stone. The bladder
is unremarkable. The adrenal glands are unremarkable as well. A
myelolipoma is incidentally seen in the right adrenal gland of no
significance.

Stomach/Bowel: A lipoma seen in the third portion of the duodenum.
Small bowel is otherwise normal. Colonic diverticulosis is seen
without diverticulitis. The appendix is normal.

Vascular/Lymphatic: Atherosclerotic changes seen in the non
aneurysmal abdominal aorta and iliac vessels. No adenopathy.

Reproductive: Prostate is unremarkable.

Other: A fat containing umbilical hernia is identified. No free air
or free fluid.

Musculoskeletal: No acute or significant osseous findings.
IMPRESSION: 1. No cause for urinary retention identified.
2. Atherosclerotic changes.
3. No other acute abnormalities.

## 2018-10-28 ENCOUNTER — Other Ambulatory Visit: Payer: Self-pay | Admitting: Family Medicine

## 2018-10-28 DIAGNOSIS — Z136 Encounter for screening for cardiovascular disorders: Secondary | ICD-10-CM

## 2018-10-28 DIAGNOSIS — E119 Type 2 diabetes mellitus without complications: Secondary | ICD-10-CM

## 2018-11-03 ENCOUNTER — Ambulatory Visit: Payer: BC Managed Care – PPO

## 2018-11-05 ENCOUNTER — Other Ambulatory Visit: Payer: Self-pay | Admitting: Family Medicine

## 2018-11-05 DIAGNOSIS — E119 Type 2 diabetes mellitus without complications: Secondary | ICD-10-CM

## 2018-11-05 DIAGNOSIS — Z136 Encounter for screening for cardiovascular disorders: Secondary | ICD-10-CM

## 2019-07-03 ENCOUNTER — Encounter: Payer: Self-pay | Admitting: Emergency Medicine

## 2019-07-03 ENCOUNTER — Ambulatory Visit
Admission: EM | Admit: 2019-07-03 | Discharge: 2019-07-03 | Disposition: A | Discharge status: Home / Self Care | Payer: BC Managed Care – PPO | Attending: Family Medicine | Admitting: Family Medicine

## 2019-07-03 ENCOUNTER — Other Ambulatory Visit: Payer: Self-pay

## 2019-07-03 DIAGNOSIS — L03115 Cellulitis of right lower limb: Secondary | ICD-10-CM | POA: Diagnosis not present

## 2019-07-03 DIAGNOSIS — W57XXXA Bitten or stung by nonvenomous insect and other nonvenomous arthropods, initial encounter: Secondary | ICD-10-CM

## 2019-07-03 MED ORDER — DOXYCYCLINE HYCLATE 100 MG PO CAPS: 100.0000 mg | ORAL_CAPSULE | Freq: Two times a day (BID) | ORAL | 0 refills | Status: AC

## 2019-07-03 MED ORDER — MUPIROCIN 2 % EX OINT: TOPICAL_OINTMENT | CUTANEOUS | 0 refills | Status: AC

## 2019-07-03 NOTE — Discharge Instructions (Addendum)
Take medication as prescribed. Rest. Elevate. Monitor.   Follow up with your primary care physician this week as needed. Return to Urgent care for new or worsening concerns.

## 2019-07-03 NOTE — ED Provider Notes (Addendum)
[facility] ____________________________________________  Time seen: Approximately 3:57 PM  I have reviewed the triage vital signs and the nursing notes.   HISTORY  Chief Complaint Insect Bite   HPI [name] [name] is a 68 y.o. male presenting for evaluation of insect to right lower leg.  Patient states last few days he has been outside and doing some work around his house, and yesterday he noticed a small red area that has continued to increase in size.  States it has been somewhat itchy.  Mildly tender today.  No drainage.  Unsure of what bit him.  Denies known tick bite or tick attachment.  No accompanying fevers, atypical swelling, paresthesias, pain radiation or other complaints.  No recent cough or sickness.  Reports tetanus immunization is up-to-date, last given in 2015.  Reports otherwise doing well.  Valera Castle, MD: PCP   Past Medical History:  Diagnosis Date  . Arthritis    knees- hx. gout  . Diabetes mellitus without complication ([place])   . History of gout   . Hyperlipidemia   . Hypertension   . Psoriasis    "one spot on my rt leg"  . Skin cancer     Patient Active Problem List   Diagnosis Date Noted  . Osteoarthritis of left knee 05/24/2014  . Total knee replacement status 05/24/2014  . Postoperative anemia due to acute blood loss 12/10/2012  . Osteoarthritis of left hip 12/08/2012    Past Surgical History:  Procedure Laterality Date  . SKIN CANCER EXCISION  2018   basal cell  . TOTAL KNEE ARTHROPLASTY Right 12/08/2012   Procedure: TOTAL KNEE ARTHROPLASTY;  Surgeon: Tobi Bastos, MD;  Location: [facility];  Service: Orthopedics;  Laterality: Right;  . TOTAL KNEE ARTHROPLASTY Left 05/24/2014   Procedure: LEFT TOTAL KNEE ARTHROPLASTY;  Surgeon: Tobi Bastos, MD;  Location: [facility];  Service: Orthopedics;  Laterality: Left;     No current facility-administered medications for this encounter.   Current Outpatient Medications:  .   allopurinol (ZYLOPRIM) 100 MG tablet, Take 100 mg by mouth daily., Disp: , Rfl:  .  atorvastatin (LIPITOR) 20 MG tablet, Take 40 mg by mouth daily. , Disp: , Rfl:  .  glipiZIDE (GLUCOTROL) 10 MG tablet, Take 10 mg by mouth daily before breakfast., Disp: , Rfl:  .  lisinopril-hydrochlorothiazide (PRINZIDE,ZESTORETIC) 20-25 MG per tablet, Take 1 tablet by mouth daily before breakfast., Disp: , Rfl:  .  metFORMIN (GLUCOPHAGE) 1000 MG tablet, Take 2,000 mg by mouth daily. Extended release, Disp: , Rfl:  .  pioglitazone (ACTOS) 45 MG tablet, Take 45 mg by mouth daily., Disp: , Rfl:  .  simvastatin (ZOCOR) 40 MG tablet, Take 40 mg by mouth every evening., Disp: , Rfl:  .  Acetaminophen 500 MG coapsule, Take by mouth., Disp: , Rfl:  .  albuterol (PROVENTIL HFA;VENTOLIN HFA) 108 (90 Base) MCG/ACT inhaler, Inhale 2 puffs into the lungs every 6 (six) hours as needed for wheezing or shortness of breath., Disp: 1 Inhaler, Rfl: 0 .  ASPIRIN 81 PO, Take by mouth., Disp: , Rfl:  .  doxycycline (VIBRAMYCIN) 100 MG capsule, Take 1 capsule (100 mg total) by mouth 2 (two) times daily., Disp: 20 capsule, Rfl: 0 .  mupirocin ointment (BACTROBAN) 2 %, Apply two times a day for 7 days., Disp: 22 g, Rfl: 0 .  polyvinyl alcohol (LIQUIFILM TEARS) 1.4 % ophthalmic solution, Place 1 drop into both eyes 3 (three) times daily as needed for  dry eyes., Disp: , Rfl:  .  sildenafil (REVATIO) 20 MG tablet, May use 1-5 tabs daily 1/2 hr before intercourse, Disp: , Rfl:   Facility-Administered Medications Ordered in Other Encounters:  .  dexamethasone (DECADRON) injection 10 mg, 10 mg, Intravenous, Once, Cecilio Asper, [employer], PA-C  Allergies Patient has no known allergies.  Family History  Problem Relation Age of Onset  . Kidney cancer Neg Hx   . Prostate cancer Neg Hx   . Bladder Cancer Neg Hx     Social History Social History   Tobacco Use  . Smoking status: Former Smoker    Types: Cigarettes    Quit date: 05/16/1994     Years since quitting: 25.1  . Smokeless tobacco: Former Systems developer    Quit date: 12/01/1988  Substance Use Topics  . Alcohol use: Yes    Comment: occ.  . Drug use: No    Review of Systems Constitutional: No fever ENT: No sore throat. Cardiovascular: Denies chest pain. Respiratory: Denies shortness of breath. Gastrointestinal: No abdominal pain.   Musculoskeletal: Negative for back pain. Skin: Positive skin changes. Neurological: Negative for focal weakness or numbness.   ____________________________________________   PHYSICAL EXAM:  VITAL SIGNS: ED Triage Vitals  Enc Vitals Group     BP 07/03/19 1534 129/79     Pulse Rate 07/03/19 1534 87     Resp 07/03/19 1534 16     Temp 07/03/19 1534 98.1 F (36.7 C)     Temp Source 07/03/19 1534 Oral     SpO2 07/03/19 1534 98 %     Weight 07/03/19 1532 300 lb (136.1 kg)     Height 07/03/19 1532 5\' 8"  (1.727 m)     Head Circumference --      Peak Flow --      Pain Score 07/03/19 1532 2     Pain Loc --      Pain Edu? --      Excl. in [place]? --     Constitutional: Alert and oriented. Well appearing and in no acute distress. Eyes: Conjunctivae are normal.  ENT      Head: Normocephalic and atraumatic. Cardiovascular: Normal rate, regular rhythm. Grossly normal heart sounds.  Good peripheral circulation. Respiratory: Normal respiratory effort without tachypnea nor retractions. Breath sounds are clear and equal bilaterally. No wheezes, rales, rhonchi. Musculoskeletal:  Bilateral pedal pulses equal and easily palpated.  Steady gait. Neurologic:  Normal speech and language. Speech is normal. No gait instability.  Skin:  Skin is warm, dry,  Except: Right lateral lower ankle 2 x 2 centimeter area of darkened erythema with centered punctum with mild anterior surrounding erythema, no induration, no drainage, no fluctuance, no distal edema, no calf tenderness, normal distal sensation. No eschar.  Psychiatric: Mood and affect are normal. Speech and  behavior are normal. Patient exhibits appropriate insight and judgment   ___________________________________________   LABS (all labs ordered are listed, but only abnormal results are displayed)  Labs Reviewed - No data to display ____________________________________________  PROCEDURES Procedures    INITIAL IMPRESSION / ASSESSMENT AND PLAN / ED COURSE  Pertinent labs & imaging results that were available during my care of the patient were reviewed by me and considered in my medical decision making (see chart for details).  Overall well-appearing patient.  No acute distress.  Right lateral ankle appearance of insect bite with secondary cellulitis.  Tetanus immunization is up-to-date. Denies systemic symptoms.  Will treat with doxycycline and topical Bactroban.  Encourage elevation, keeping clean  and very close monitoring. Discussed indication, risks and benefits of medications with patient.  Discussed follow up with Primary care physician this week. Discussed follow up and return parameters including no resolution or any worsening concerns. Patient verbalized understanding and agreed to plan.   ____________________________________________   FINAL CLINICAL IMPRESSION(S) / ED DIAGNOSES  Final diagnoses:  Cellulitis of right lower extremity  Insect bite, unspecified site, initial encounter     ED Discharge Orders         Ordered    doxycycline (VIBRAMYCIN) 100 MG capsule  2 times daily     07/03/19 1556    mupirocin ointment (BACTROBAN) 2 %     07/03/19 1556           Note: This dictation was prepared with Dragon dictation along with smaller phrase technology. Any transcriptional errors that result from this process are unintentional.         Marylene Land, NP 07/03/19 1615

## 2019-07-03 NOTE — ED Triage Notes (Signed)
Patient c/o insect bite to his right ankle yesterday.  Patient reports redness and swelling in his right lower leg.

## 2022-06-24 ENCOUNTER — Other Ambulatory Visit: Payer: Self-pay | Admitting: Family Medicine

## 2022-06-24 DIAGNOSIS — Z87891 Personal history of nicotine dependence: Secondary | ICD-10-CM

## 2022-06-24 DIAGNOSIS — Z136 Encounter for screening for cardiovascular disorders: Secondary | ICD-10-CM

## 2022-06-24 DIAGNOSIS — E782 Mixed hyperlipidemia: Secondary | ICD-10-CM

## 2022-07-02 ENCOUNTER — Ambulatory Visit
Admission: RE | Admit: 2022-07-02 | Discharge: 2022-07-02 | Disposition: A | Discharge status: Home / Self Care | Payer: Medicare PPO | Source: Ambulatory Visit | Attending: Family Medicine | Admitting: Family Medicine

## 2022-07-02 DIAGNOSIS — Z87891 Personal history of nicotine dependence: Secondary | ICD-10-CM | POA: Diagnosis present

## 2022-07-02 DIAGNOSIS — E782 Mixed hyperlipidemia: Secondary | ICD-10-CM | POA: Insufficient documentation

## 2022-07-02 DIAGNOSIS — Z136 Encounter for screening for cardiovascular disorders: Secondary | ICD-10-CM | POA: Diagnosis not present

## 2023-01-15 ENCOUNTER — Encounter: Payer: Medicare PPO | Attending: Physician Assistant | Admitting: Physician Assistant

## 2023-01-15 DIAGNOSIS — L98492 Non-pressure chronic ulcer of skin of other sites with fat layer exposed: Secondary | ICD-10-CM | POA: Insufficient documentation

## 2023-01-15 DIAGNOSIS — I10 Essential (primary) hypertension: Secondary | ICD-10-CM | POA: Insufficient documentation

## 2023-01-15 DIAGNOSIS — S61411A Laceration without foreign body of right hand, initial encounter: Secondary | ICD-10-CM | POA: Insufficient documentation

## 2023-01-15 DIAGNOSIS — Z09 Encounter for follow-up examination after completed treatment for conditions other than malignant neoplasm: Secondary | ICD-10-CM | POA: Diagnosis not present

## 2023-01-15 DIAGNOSIS — X58XXXA Exposure to other specified factors, initial encounter: Secondary | ICD-10-CM | POA: Diagnosis not present

## 2023-01-15 DIAGNOSIS — Z87891 Personal history of nicotine dependence: Secondary | ICD-10-CM | POA: Insufficient documentation

## 2023-01-15 DIAGNOSIS — E11622 Type 2 diabetes mellitus with other skin ulcer: Secondary | ICD-10-CM | POA: Diagnosis not present

## 2023-01-16 NOTE — Progress Notes (Signed)
[name], [name] ([id number]) [id number] Page 1 of 4 Visit Report for 01/15/2023 Abuse Risk Screen Details Patient Name: Date of Service: [name] [name], [name] [name] 01/15/2023 3:00 PM Medical Record Number: [id number] Patient Account Number: [id number] Date of Birth/Sex: Treating RN: 03/31/1951 (72 y.o. Laymond Purser Primary Care Prestin Munch: Rolin Barry Other Clinician: Referring Emelie Newsom: Treating Ariannie Penaloza/Extender: Quin Hoop in Treatment: 0 Abuse Risk Screen Items Answer ABUSE RISK SCREEN: Has anyone close to you tried to hurt or harm you recentlyo No Do you feel uncomfortable with anyone in your familyo No Has anyone forced you do things that you didnt want to doo No Electronic Signature(s) Signed: 01/15/2023 5:07:08 PM By: Angelina Pih Entered By: Angelina Pih on 01/15/2023 15:21:52 -------------------------------------------------------------------------------- Activities of Daily Living Details Patient Name: Date of Service: [name], [name] 01/15/2023 3:00 PM Medical Record Number: [id number] Patient Account Number: [id number] Date of Birth/Sex: Treating RN: 1951-08-11 (72 y.o. Laymond Purser Primary Care Jas Betten: Rolin Barry Other Clinician: Referring Kelsie Zaborowski: Treating Audri Kozub/Extender: Quin Hoop in Treatment: 0 Activities of Daily Living Items Answer Activities of Daily Living (Please select one for each item) Drive Automobile Completely Able T Medications ake Completely Able Use T elephone Completely Able Care for Appearance Completely Able Use T oilet Completely Able Bath / Shower Completely Able Dress Self Completely Able Feed Self Completely Able Walk Completely Able Get In / Out Bed Completely Able Housework Completely Able Prepare Meals Completely Able Handle Money Completely Able Shop for Self Completely Able Electronic Signature(s) Signed: 01/15/2023 5:07:08 PM By: Angelina Pih Entered By: Angelina Pih on 01/15/2023 15:22:12 -------------------------------------------------------------------------------- Education Screening Details Patient Name: Date of Service: [name] [name]. 01/15/2023 3:00 PM Medical Record Number: [id number] Patient Account Number: [id number] Date of Birth/Sex: Treating RN: [birth year] (72 y.o. Laymond Purser Primary Care Kamonte Mcmichen: Rolin Barry Other Clinician: Referring Abdifatah Colquhoun: Treating Gyasi Hazzard/Extender: Quin Hoop in Treatment: 0 [name], [name] ([id number]) [phone] [id number].pdf Page 2 of 4 Learning Preferences/Education Level/Primary Language Learning Preference: Explanation, Demonstration, Video, Public affairs consultant, Printed Material Preferred Language: Economist Language Barrier: No Translator Needed: No Memory Deficit: No Emotional Barrier: No Cultural/Religious Beliefs Affecting Medical Care: No Physical Barrier Impaired Vision: Yes Glasses Impaired Hearing: No Decreased Hand dexterity: No Knowledge/Comprehension Knowledge Level: High Comprehension Level: High Ability to understand written instructions: High Ability to understand verbal instructions: High Motivation Anxiety Level: Calm Cooperation: Cooperative Education Importance: Acknowledges Need Interest in Health Problems: Asks Questions Perception: Coherent Willingness to Engage in Self-Management High Activities: Readiness to Engage in Self-Management High Activities: Electronic Signature(s) Signed: 01/15/2023 5:07:08 PM By: Angelina Pih Entered By: Angelina Pih on 01/15/2023 15:22:34 -------------------------------------------------------------------------------- Fall Risk Assessment Details Patient Name: Date of Service: [name] [name]. 01/15/2023 3:00 PM Medical Record Number: [id number] Patient Account Number: [id number] Date of Birth/Sex: Treating RN: 1951/04/24 (72 y.o. Laymond Purser Primary Care Jolana Runkles: Rolin Barry Other Clinician: Referring Joeziah Voit: Treating Younique Casad/Extender: Quin Hoop in Treatment: 0 Fall Risk Assessment Items Have you had 2 or more falls in the last 12 monthso 0 No Have you had any fall that resulted in injury in the last 12 monthso 0 No FALLS RISK SCREEN History of falling - immediate or within 3 months 0 No Secondary diagnosis (Do you have 2 or more medical diagnoseso) 0 No Ambulatory aid None/bed rest/wheelchair/nurse 0 Yes Crutches/cane/walker 0 No Furniture 0 No Intravenous therapy Access/Saline/Heparin Lock 0 No Gait/Transferring Normal/ bed rest/ wheelchair 0 Yes Weak (short steps  with or without shuffle, stooped but able to lift head while walking, may seek 0 No support from furniture) Impaired (short steps with shuffle, may have difficulty arising from chair, head down, impaired 0 No balance) Mental Status Oriented to own ability 0 Yes Electronic Signature(s) QUIRINO, RENNA ([id number]) [id number].pdf Page 3 of 4 Signed: 01/15/2023 5:07:08 PM By: Angelina Pih Entered By: Angelina Pih on 01/15/2023 15:22:46 -------------------------------------------------------------------------------- Foot Assessment Details Patient Name: Date of Service: [name], [name] 01/15/2023 3:00 PM Medical Record Number: [id number] Patient Account Number: [id number] Date of Birth/Sex: Treating RN: 01-18-1951 (72 y.o. Laymond Purser Primary Care Reggie Welge: Rolin Barry Other Clinician: Referring Angelle Isais: Treating Shivon Hackel/Extender: Quin Hoop in Treatment: 0 Foot Assessment Items Site Locations + = Sensation present, - = Sensation absent, C = Callus, U = Ulcer R = Redness, W = Warmth, M = Maceration, PU = Pre-ulcerative lesion F = Fissure, S = Swelling, D = Dryness Assessment Right: Left: Other Deformity: No No Prior Foot Ulcer: No No Prior  Amputation: No No Charcot Joint: No No Ambulatory Status: Ambulatory Without Help Gait: Steady Notes wound on right hand Electronic Signature(s) Signed: 01/15/2023 5:07:08 PM By: Angelina Pih Entered By: Angelina Pih on 01/15/2023 15:23:10 -------------------------------------------------------------------------------- Nutrition Risk Screening Details Patient Name: Date of Service: MAYFORD, BECKSTRAND 01/15/2023 3:00 PM Medical Record Number: [id number] Patient Account Number: [id number] Date of Birth/Sex: Treating RN: Jan 24, 1951 (72 y.o. Laymond Purser Primary Care Tannya Gonet: Rolin Barry Other Clinician: Referring Jerolene Kupfer: Treating Consuello Lassalle/Extender: Quin Hoop in Treatment: 0 Height (in): 68 Weight (lbs): 291 Body Mass Index (BMI): 44.2 ERVEN, WINKLE ([id number]) [id number] Page 4 of 4 Nutrition Risk Screening Items Score Screening NUTRITION RISK SCREEN: I have an illness or condition that made me change the kind and/or amount of food I eat 0 No I eat fewer than two meals per day 0 No I eat few fruits and vegetables, or milk products 0 No I have three or more drinks of beer, liquor or wine almost every day 0 No I have tooth or mouth problems that make it hard for me to eat 0 No I don't always have enough money to buy the food I need 0 No I eat alone most of the time 0 No I take three or more different prescribed or over-the-counter drugs a day 0 No Without wanting to, I have lost or gained 10 pounds in the last six months 0 No I am not always physically able to shop, cook and/or feed myself 0 No Nutrition Protocols Good Risk Protocol 0 No interventions needed Moderate Risk Protocol High Risk Proctocol Risk Level: Good Risk Score: 0 Electronic Signature(s) Signed: 01/15/2023 5:07:08 PM By: Angelina Pih Entered By: Angelina Pih on 01/15/2023 15:22:58

## 2023-01-16 NOTE — Progress Notes (Signed)
AMALIO, LOE ([id number]) [id number] Page 1 of 7 Visit Report for 01/15/2023 Allergy List Details Patient Name: Date of Service: [name] [name], [name] [name] 01/15/2023 3:00 PM Medical Record Number: [id number] Patient Account Number: [id number] Date of Birth/Sex: Treating RN: 18-Jan-1951 (72 y.o. [name] [name] Primary Care [name] [name] Other Clinician: Referring Morrill Bomkamp: Treating Merry Pond/Extender: Quin Hoop in Treatment: 0 Allergies Active Allergies No Known Drug Allergies Allergy Notes Electronic Signature(s) Signed: 01/15/2023 5:07:08 PM By: Angelina Pih Entered By: Angelina Pih on 01/15/2023 15:17:14 -------------------------------------------------------------------------------- Arrival Information Details Patient Name: Date of Service: [name] 01/15/2023 3:00 PM Medical Record Number: [id number] Patient Account Number: [id number] Date of Birth/Sex: Treating RN: [birth year] (72 y.o. [name] [name] Primary Care Keniel Ralston: [name] [name] Other Clinician: Referring Ray Gervasi: Treating Ariella Voit/Extender: Quin Hoop in Treatment: 0 Visit Information Patient Arrived: Ambulatory Arrival Time: 15:12 Accompanied By: self Transfer Assistance: None Patient Identification Verified: Yes Secondary Verification Process Completed: Yes Patient Has Alerts: Yes Patient Alerts: Patient on Blood Thinner type 2 diabetic Electronic Signature(s) Signed: 01/15/2023 4:35:00 PM By: Angelina Pih Entered By: Angelina Pih on 01/15/2023 16:35:00 -------------------------------------------------------------------------------- Clinic Level of Care Assessment Details Patient Name: Date of Service: TIMOTHEUS, SALM 01/15/2023 3:00 PM Medical Record Number: [id number] Patient Account Number: [id number] Date of Birth/Sex: Treating RN: December 14, 1950 (72 y.o. [name] [name] Primary Care Arif Amendola: [name] [name] Other  Clinician: Referring Romesha Scherer: Treating Krystall Kruckenberg/Extender: Quin Hoop in Treatment: 0 Clinic Level of Care Assessment Items TOOL 2 Quantity Score  - 0 Use when only an EandM is performed on the INITIAL visit ASSESSMENTS - Nursing Assessment / Reassessment X- 1 20 General Physical Exam (combine w/ comprehensive assessment (listed just below) when performed on new pt. evals) X- 1 25 Comprehensive Assessment (HX, ROS, Risk Assessments, Wounds Hx, etc.) TERRYN, REDNER ([id number]) [id number] Page 2 of 7 ASSESSMENTS - Wound and Skin A ssessment / Reassessment X - Simple Wound Assessment / Reassessment - one wound 1 5  - 0 Complex Wound Assessment / Reassessment - multiple wounds  - 0 Dermatologic / Skin Assessment (not related to wound area) ASSESSMENTS - Ostomy and/or Continence Assessment and Care  - 0 Incontinence Assessment and Management  - 0 Ostomy Care Assessment and Management (repouching, etc.) PROCESS - Coordination of Care X - Simple Patient / Family Education for ongoing care 1 15  - 0 Complex (extensive) Patient / Family Education for ongoing care X- 1 10 Staff obtains Chiropractor, Records, T Results / Process Orders est  - 0 Staff telephones HHA, Nursing Homes / Clarify orders / etc  - 0 Routine Transfer to another Facility (non-emergent condition)  - 0 Routine Hospital Admission (non-emergent condition) X- 1 15 New Admissions / Manufacturing engineer / Ordering NPWT Apligraf, etc. ,  - 0 Emergency Hospital Admission (emergent condition) X- 1 10 Simple Discharge Coordination  - 0 Complex (extensive) Discharge Coordination PROCESS - Special Needs  - 0 Pediatric / Minor Patient Management  - 0 Isolation Patient Management  - 0 Hearing / Language / Visual special needs  - 0 Assessment of Community assistance (transportation, D/C planning, etc.)  - 0 Additional assistance /  Altered mentation  - 0 Support Surface(s) Assessment (bed, cushion, seat, etc.) INTERVENTIONS - Wound Cleansing / Measurement X- 1 5 Wound Imaging (photographs - any number of wounds)  - 0 Wound Tracing (instead of photographs) X- 1 5 Simple Wound Measurement - one wound  - 0 Complex Wound Measurement - multiple wounds  X- 1 5 Simple Wound Cleansing - one wound []  - 0 Complex Wound Cleansing - multiple wounds INTERVENTIONS - Wound Dressings X - Small Wound Dressing one or multiple wounds 1 10 []  - 0 Medium Wound Dressing one or multiple wounds []  - 0 Large Wound Dressing one or multiple wounds []  - 0 Application of Medications - injection INTERVENTIONS - Miscellaneous []  - 0 External ear exam []  - 0 Specimen Collection (cultures, biopsies, blood, body fluids, etc.) []  - 0 Specimen(s) / Culture(s) sent or taken to Lab for analysis []  - 0 Patient Transfer (multiple staff / Michiel Sites Lift / Similar devices) []  - 0 Simple Staple / Suture removal (25 or less) []  - 0 Complex Staple / Suture removal (26 or more) Sidney, Vedder W ([id number]) [id number] Page 3 of 7 []  - 0 Hypo / Hyperglycemic Management (close monitor of Blood Glucose) []  - 0 Ankle / Brachial Index (ABI) - do not check if billed separately Has the patient been seen at the hospital within the last three years: Yes Total Score: 125 Level Of Care: New/Established - Level 4 Electronic Signature(s) Signed: 01/15/2023 5:07:08 PM By: Angelina Pih Entered By: Angelina Pih on 01/15/2023 15:49:33 -------------------------------------------------------------------------------- Encounter Discharge Information Details Patient Name: Date of Service: [name] 01/15/2023 3:00 PM Medical Record Number: [id number] Patient Account Number: [id number] Date of Birth/Sex: Treating RN: [birth year] (72 y.o. [name] [name] Primary Care Webb Weed: [name] [name] Other Clinician: Referring  Lavaun Greenfield: Treating Lourdes Manning/Extender: Quin Hoop in Treatment: 0 Encounter Discharge Information Items Discharge Condition: Stable Ambulatory Status: Ambulatory Discharge Destination: Home Transportation: Private Auto Accompanied By: self Schedule Follow-up Appointment: Yes Clinical Summary of Care: Electronic Signature(s) Signed: 01/15/2023 5:07:08 PM By: Angelina Pih Entered By: Angelina Pih on 01/15/2023 15:52:05 -------------------------------------------------------------------------------- Lower Extremity Assessment Details Patient Name: Date of Service: CANEK, KLEISER 01/15/2023 3:00 PM Medical Record Number: [id number] Patient Account Number: [id number] Date of Birth/Sex: Treating RN: Feb 09, 1951 (72 y.o. [name] [name] Primary Care Tawn Fitzner: [name] [name] Other Clinician: Referring Kwinton Maahs: Treating Mareo Portilla/Extender: Quin Hoop in Treatment: 0 Electronic Signature(s) Signed: 01/15/2023 5:07:08 PM By: Angelina Pih Entered By: Angelina Pih on 01/15/2023 15:17:08 -------------------------------------------------------------------------------- Multi Wound Chart Details Patient Name: Date of Service: [name] 01/15/2023 3:00 PM Medical Record Number: [id number] Patient Account Number: [id number] Date of Birth/Sex: Treating RN: 1950-12-23 (72 y.o. [name] [name] Primary Care Yaquelin Langelier: [name] [name] Other Clinician: Referring Carmelia Tiner: Treating Christine Morton/Extender: Quin Hoop in Treatment: 0 Vital Signs Height(in): 68 Pulse(bpm): 85 Weight(lbs): 291 Blood Pressure(mmHg): 116/75 Body Mass Index(BMI): 44.2 Temperature(F): 97.7 Respiratory Rate(breaths/min): 18 [Clubb, Nikos W ([id number]):Photos:] [[id number] Page 4 of 7:1 N/A N/A N/A N/A] Right Hand - Palm N/A N/A Wound Location: Trauma N/A N/A Wounding Event: Trauma, Other N/A N/A Primary  Etiology: Hypertension, Type II Diabetes, Gout N/A N/A Comorbid History: 01/11/2023 N/A N/A Date Acquired: 0 N/A N/A Weeks of Treatment: Open N/A N/A Wound Status: No N/A N/A Wound Recurrence: 2x1.6x0.3 N/A N/A Measurements L x W x D (cm) 2.513 N/A N/A A (cm) : rea 0.754 N/A N/A Volume (cm) : Full Thickness With Exposed Support N/A N/A Classification: Structures Medium N/A N/A Exudate A mount: Serosanguineous N/A N/A Exudate Type: red, brown N/A N/A Exudate Color: Large (67-100%) N/A N/A Granulation A mount: Red, Pink N/A N/A Granulation Quality: Small (1-33%) N/A N/A Necrotic A mount: Fat Layer (Subcutaneous Tissue): Yes N/A N/A Exposed Structures: None N/A N/A Epithelialization: Treatment Notes Electronic Signature(s) Signed: 01/15/2023 5:07:08 PM  By: Angelina Pih Entered By: Angelina Pih on 01/15/2023 15:36:48 -------------------------------------------------------------------------------- Multi-Disciplinary Care Plan Details Patient Name: Date of Service: BEVERLEY, SHERRARD. 01/15/2023 3:00 PM Medical Record Number: [id number] Patient Account Number: [id number] Date of Birth/Sex: Treating RN: 03/26/1951 (72 y.o. [name] [name] Primary Care Danett Palazzo: [name] [name] Other Clinician: Referring Mckensey Berghuis: Treating Kathryn Cosby/Extender: Quin Hoop in Treatment: 0 Active Inactive Wound/Skin Impairment Nursing Diagnoses: Impaired tissue integrity Knowledge deficit related to ulceration/compromised skin integrity Goals: Ulcer/skin breakdown will have a volume reduction of 30% by week 4 Date Initiated: 01/15/2023 Target Resolution Date: 02/12/2023 Goal Status: Active Ulcer/skin breakdown will have a volume reduction of 50% by week 8 Date Initiated: 01/15/2023 Target Resolution Date: 03/12/2023 Goal Status: Active Ulcer/skin breakdown will have a volume reduction of 80% by week 12 Date Initiated: 01/15/2023 Target Resolution Date:  04/09/2023 Goal Status: Active Ulcer/skin breakdown will heal within 14 weeks Date Initiated: 01/15/2023 Target Resolution Date: 04/23/2023 Goal Status: Active Interventions: Assess patient/caregiver ability to obtain necessary supplies THADDIUS, MANES ([id number]) [id number] Page 5 of 7 Assess patient/caregiver ability to perform ulcer/skin care regimen upon admission and as needed Assess ulceration(s) every visit Provide education on ulcer and skin care Treatment Activities: Skin care regimen initiated : 01/15/2023 Topical wound management initiated : 01/15/2023 Notes: Electronic Signature(s) Signed: 01/15/2023 5:07:08 PM By: Angelina Pih Entered By: Angelina Pih on 01/15/2023 15:50:42 -------------------------------------------------------------------------------- Pain Assessment Details Patient Name: Date of Service: MICHAELL, GRIDER 01/15/2023 3:00 PM Medical Record Number: [id number] Patient Account Number: [id number] Date of Birth/Sex: Treating RN: [birth year] (72 y.o. [name] [name] Primary Care Adelle Zachar: [name] [name] Other Clinician: Referring Parvin Stetzer: Treating Jeymi Hepp/Extender: Quin Hoop in Treatment: 0 Active Problems Location of Pain Severity and Description of Pain Patient Has Paino No Site Locations Rate the pain. Current Pain Level: 0 Pain Management and Medication Current Pain Management: Electronic Signature(s) Signed: 01/15/2023 5:07:08 PM By: Angelina Pih Entered By: Angelina Pih on 01/15/2023 15:13:36 -------------------------------------------------------------------------------- Patient/Caregiver Education Details Patient Name: Date of Service: Meador, Germain W. 4/11/2024andnbsp3:00 PM Medical Record Number: [id number] Patient Account Number: [id number] Date of Birth/Gender: Treating RN: 1951/07/30 (72 y.o. [name] [name] Primary Care Physician: [name] [name] Other Clinician: Referring  Physician: Treating Physician/Extender: Quin Hoop in Treatment: 0 Education Assessment Education Provided To: KINAN, SAFLEY ([id number]) [id number] Page 6 of 7 Patient Education Topics Provided Welcome T [facility]-New Patient Packet: o Handouts: [facility] P[facility] form, Welcome T [facility] o Methods: Explain/Verbal Responses: State content correctly Wound/Skin Impairment: Handouts: Caring for Your Ulcer Methods: Explain/Verbal Responses: State content correctly Electronic Signature(s) Signed: 01/15/2023 5:07:08 PM By: Angelina Pih Entered By: Angelina Pih on 01/15/2023 15:51:05 -------------------------------------------------------------------------------- Wound Assessment Details Patient Name: Date of Service: YVON, MCCORD 01/15/2023 3:00 PM Medical Record Number: [id number] Patient Account Number: [id number] Date of Birth/Sex: Treating RN: [birth year] (72 y.o. [name] [name] Primary Care Madeleyn Schwimmer: [name] [name] Other Clinician: Referring Jelani Trueba: Treating Hydia Copelin/Extender: Quin Hoop in Treatment: 0 Wound Status Wound Number: 1 Primary Etiology: Trauma, Other Wound Location: Right Hand - Palm Wound Status: Open Wounding Event: Trauma Comorbid History: Hypertension, Type II Diabetes, Gout Date Acquired: 01/11/2023 Weeks Of Treatment: 0 Clustered Wound: No Photos Wound Measurements Length: (cm) 2 Width: (cm) 1.6 Depth: (cm) 0.3 Area: (cm) 2.513 Volume: (cm) 0.754 % Reduction in Area: % Reduction in Volume: Epithelialization: None Tunneling: No Undermining: No Wound Description Classification: Full Thickness With Exposed Support Structures Exudate Amount: Medium Exudate  Type: Serosanguineous Exudate Color: red, brown Foul Odor After Cleansing: No Slough/Fibrino Yes Wound Bed Granulation Amount: Large (67-100%) Exposed Structure Granulation  Quality: Red, Pink Fat Layer (Subcutaneous Tissue) Exposed: Yes Necrotic Amount: Small (1-33%) Necrotic Quality: Adherent Slough Treatment Notes YOUNG, MULVEY ([id number]) [id number] Page 7 of 7 Wound #1 (Hand - Palm) Wound Laterality: Right Cleanser Byram Ancillary Kit - 15 Day Supply Discharge Instruction: Use supplies as instructed; Kit contains: (15) Saline Bullets; (15) 3x3 Gauze; 15 pr Gloves Soap and Water Discharge Instruction: Gently cleanse wound with antibacterial soap, rinse and pat dry prior to dressing wounds Peri-Wound Care Topical Mupirocin Ointment Discharge Instruction: Apply as directed by Phala Schraeder. THIN LAYER Primary Dressing Prisma 4.34 (in) Discharge Instruction: Moisten w/normal saline or sterile water; Cover wound as directed. Do not remove from wound bed. Secondary Dressing Non-Adherent Pad 3x8 (in/in) Discharge Instruction: cut small square to cover wound Secured With Tegaderm Film 4x4 (in/in) Discharge Instruction: Apply to wound bed Compression Wrap Compression Stockings Add-Ons Electronic Signature(s) Signed: 01/15/2023 5:07:08 PM By: Angelina Pih Entered By: Angelina Pih on 01/15/2023 15:36:40 -------------------------------------------------------------------------------- Vitals Details Patient Name: Date of Service: [name] 01/15/2023 3:00 PM Medical Record Number: [id number] Patient Account Number: [id number] Date of Birth/Sex: Treating RN: Nov 17, 1950 (72 y.o. [name] [name] Primary Care Kegan Shepardson: [name] [name] Other Clinician: Referring Johnwesley Lederman: Treating Elzie Sheets/Extender: Quin Hoop in Treatment: 0 Vital Signs Time Taken: 15:13 Temperature (F): 97.7 Height (in): 68 Pulse (bpm): 85 Source: Stated Respiratory Rate (breaths/min): 18 Weight (lbs): 291 Blood Pressure (mmHg): 116/75 Source: Stated Reference Range: 80 - 120 mg / dl Body Mass Index (BMI): 44.2 Electronic  Signature(s) Signed: 01/15/2023 5:07:08 PM By: Angelina Pih Entered By: Angelina Pih on 01/15/2023 15:16:56

## 2023-01-16 NOTE — Progress Notes (Signed)
CREW, GOREN ([id number]) [id number] Page 1 of 7 Visit Report for 01/15/2023 Chief Complaint Document Details Patient Name: Date of Service: [name] [name], [name] [name] 01/15/2023 3:00 PM Medical Record Number: [id number] Patient Account Number: [id number] Date of Birth/Sex: Treating RN: May 03, 1951 (72 y.o. [name] [name] Primary Care Provider: Rolin Barry Other Clinician: Referring Provider: Treating Provider/Extender: Quin Hoop in Treatment: 0 Information Obtained from: Patient Chief Complaint Right hand laceration Electronic Signature(s) Signed: 01/15/2023 3:33:50 PM By: Allen Derry PA-C Entered By: Allen Derry on 01/15/2023 15:33:49 -------------------------------------------------------------------------------- HPI Details Patient Name: Date of Service: [name] [name]. 01/15/2023 3:00 PM Medical Record Number: [id number] Patient Account Number: [id number] Date of Birth/Sex: Treating RN: [birth year] (72 y.o. [name] [name] Primary Care Provider: Rolin Barry Other Clinician: Referring Provider: Treating Provider/Extender: Quin Hoop in Treatment: 0 History of Present Illness HPI Description: 01-15-2023 upon evaluation today patient appears to be doing poorly currently in regard to a wound on his hand. This is actually his right hand right in the middle he was put together a chair on Sunday this past week that was 7 April when he pushed down on a piece putting it together and it pinched his hand and tore some of the tissue out of the middle part of his palm. Fortunately I do not see any signs of active infection systemically nor locally which is great news. Unfortunately he does have a fairly deep wound that goes down towards the muscle layer and is going to take a little bit of time to heal for sure. Patient does have a history of hypertension and diabetes mellitus type 2 but otherwise no major medical  problems. Electronic Signature(s) Signed: 01/15/2023 4:28:13 PM By: Allen Derry PA-C Entered By: Allen Derry on 01/15/2023 16:28:13 -------------------------------------------------------------------------------- Physical Exam Details Patient Name: Date of Service: [name] [name], [name] [name] 01/15/2023 3:00 PM Medical Record Number: [id number] Patient Account Number: [id number] Date of Birth/Sex: Treating RN: Apr 13, 1951 (72 y.o. [name] [name] Primary Care Provider: Rolin Barry Other Clinician: Referring Provider: Treating Provider/Extender: Quin Hoop in Treatment: 0 Constitutional sitting or standing blood pressure is within target range for patient.. pulse regular and within target range for patient.Marland Kitchen respirations regular, non-labored and within target range for patient.Marland Kitchen temperature within target range for patient.. Obese and well-hydrated in no acute distress. Eyes conjunctiva clear no eyelid edema noted. pupils equal round and reactive to light and accommodation. Ears, Nose, Mouth, and Throat no gross abnormality of ear auricles or external auditory canals. normal hearing noted during conversation. mucus membranes moist. Respiratory Gaertner, TYRRELL STEPHENS ([id number]) [id number] Page 2 of 7 normal breathing without difficulty. Musculoskeletal normal gait and posture. no significant deformity or arthritic changes, no loss or range of motion, no clubbing. Psychiatric this patient is able to make decisions and demonstrates good insight into disease process. Alert and Oriented x 3. pleasant and cooperative. Notes Upon inspection patient's wound bed actually showed signs of fairly good granulation epithelization at this point. I do not see any signs of active infection locally nor systemically which is great news and overall I am extremely pleased with where we stand currently. I do believe that he is actually doing quite well with this has been using  gentamicin cream topically. Electronic Signature(s) Signed: 01/15/2023 4:28:39 PM By: Allen Derry PA-C Entered By: Allen Derry on 01/15/2023 16:28:39 -------------------------------------------------------------------------------- Physician Orders Details Patient Name: Date of Service: [name] [name]. 01/15/2023 3:00 PM Medical Record Number: [id number] Patient Account Number: [id number] Date of  Birth/Sex: Treating RN: 15-Dec-1950 (72 y.o. [name] [name] Primary Care Provider: Rolin Barry Other Clinician: Referring Provider: Treating Provider/Extender: Quin Hoop in Treatment: 0 Verbal / Phone Orders: No Diagnosis Coding ICD-10 Coding Code Description [phone] Laceration without foreign body of right hand, initial encounter L98.492 Non-pressure chronic ulcer of skin of other sites with fat layer exposed I10 Essential (primary) hypertension E11.622 Type 2 diabetes mellitus with other skin ulcer Follow-up Appointments Return Appointment in 1 week. Bathing/ Shower/ Hygiene May shower; gently cleanse wound with antibacterial soap, rinse and pat dry prior to dressing wounds No tub bath. - no soaking hand in water Anesthetic (Use 'Patient Medications' Section for Anesthetic Order Entry) Lidocaine applied to wound bed Medications-Please add to medication list. ntibiotics - continue taking your prescribed antibiotic till finished P.O. A ntibiotic - continue using mupirocin Topical A Wound Treatment Wound #1 - Hand - Palm Wound Laterality: Right Cleanser: Byram Ancillary Kit - 15 Day Supply (DME) (Generic) 3 x Per Week/30 Days Discharge Instructions: Use supplies as instructed; Kit contains: (15) Saline Bullets; (15) 3x3 Gauze; 15 pr Gloves Cleanser: Soap and Water 3 x Per Week/30 Days Discharge Instructions: Gently cleanse wound with antibacterial soap, rinse and pat dry prior to dressing wounds Topical: Mupirocin Ointment 3 x Per Week/30 Days Discharge  Instructions: Apply as directed by provider. THIN LAYER Prim Dressing: Prisma 4.34 (in) 3 x Per Week/30 Days ary Discharge Instructions: Moisten w/normal saline or sterile water; Cover wound as directed. Do not remove from wound bed. Secondary Dressing: Non-Adherent Pad 3x8 (in/in) 3 x Per Week/30 Days Discharge Instructions: cut small square to cover wound Secured With: Tegaderm Film 4x4 (in/in) 3 x Per Week/30 Days Discharge Instructions: Apply to wound bed MARTIE, FULGHAM ([id number]) [id number] Page 3 of 7 Electronic Signature(s) Signed: 01/15/2023 5:07:08 PM By: Angelina Pih Signed: 01/15/2023 10:26:23 PM By: Allen Derry PA-C Entered By: Angelina Pih on 01/15/2023 15:51:44 -------------------------------------------------------------------------------- Problem List Details Patient Name: Date of Service: [name] [name]. 01/15/2023 3:00 PM Medical Record Number: [id number] Patient Account Number: [id number] Date of Birth/Sex: Treating RN: 03-11-1951 (71 y.o. [name] [name] Primary Care Provider: Rolin Barry Other Clinician: Referring Provider: Treating Provider/Extender: Quin Hoop in Treatment: 0 Active Problems ICD-10 Encounter Code Description Active Date MDM Diagnosis [phone] Laceration without foreign body of right hand, initial encounter 01/15/2023 No Yes L98.492 Non-pressure chronic ulcer of skin of other sites with fat layer exposed 01/15/2023 No Yes I10 Essential (primary) hypertension 01/15/2023 No Yes E11.622 Type 2 diabetes mellitus with other skin ulcer 01/15/2023 No Yes Inactive Problems Resolved Problems Electronic Signature(s) Signed: 01/15/2023 3:33:23 PM By: Allen Derry PA-C Entered By: Allen Derry on 01/15/2023 15:33:23 -------------------------------------------------------------------------------- Progress Note Details Patient Name: Date of Service: [name] [name]. 01/15/2023 3:00 PM Medical Record  Number: [id number] Patient Account Number: [id number] Date of Birth/Sex: Treating RN: 01/14/1951 (72 y.o. [name] [name] Primary Care Provider: Rolin Barry Other Clinician: Referring Provider: Treating Provider/Extender: Quin Hoop in Treatment: 0 Subjective Chief Complaint Information obtained from Patient Right hand laceration History of Present Illness (HPI) 01-15-2023 upon evaluation today patient appears to be doing poorly currently in regard to a wound on his hand. This is actually his right hand right in the middle he was put together a chair on Sunday this past week that was 7 April when he pushed down on a piece putting it together and it pinched his hand and tore some of the tissue out of the  middle part of his palm. Fortunately I do not see any signs of active infection systemically nor locally which is great news. Unfortunately he does have a fairly deep wound that goes down towards the muscle layer and is going to take a little bit of time to heal for sure. Patient does have a history of hypertension and diabetes mellitus type 2 but otherwise no major medical problems. [name] [name], [name] [name] ([id number]) [id number] Page 4 of 7 Patient History Information obtained from Patient. Allergies No Known Drug Allergies Social History Former smoker - quit 27 years, Alcohol Use - Never - previous, Drug Use - Current History - weed rarely. Medical History Cardiovascular Patient has history of Hypertension Endocrine Patient has history of Type II Diabetes Musculoskeletal Patient has history of Gout Patient is treated with Oral Agents. Blood sugar is not tested. Medical A Surgical History Notes nd Musculoskeletal arthritis Review of Systems (ROS) Constitutional Symptoms (General Health) Denies complaints or symptoms of Fatigue, Fever, Chills, Marked Weight Change. Eyes Complains or has symptoms of Glasses /  Contacts. Ear/Nose/Mouth/Throat Denies complaints or symptoms of Difficult clearing ears, Sinusitis. Hematologic/Lymphatic Denies complaints or symptoms of Bleeding / Clotting Disorders, Human Immunodeficiency Virus. Respiratory Denies complaints or symptoms of Chronic or frequent coughs, Shortness of Breath. Gastrointestinal Denies complaints or symptoms of Frequent diarrhea, Nausea, Vomiting. Genitourinary Denies complaints or symptoms of Kidney failure/ Dialysis, Incontinence/dribbling. Immunological Complains or has symptoms of Hives. Denies complaints or symptoms of Itching. Integumentary (Skin) Denies complaints or symptoms of Wounds, Bleeding or bruising tendency, Breakdown, Swelling. Musculoskeletal bilateral knee replacement Neurologic Complains or has symptoms of Numbness/parasthesias - bilat feet. Oncologic skin CA Psychiatric Denies complaints or symptoms of Anxiety, Claustrophobia. Objective Constitutional sitting or standing blood pressure is within target range for patient.. pulse regular and within target range for patient.Marland Kitchen respirations regular, non-labored and within target range for patient.Marland Kitchen temperature within target range for patient.. Obese and well-hydrated in no acute distress. Vitals Time Taken: 3:13 PM, Height: 68 in, Source: Stated, Weight: 291 lbs, Source: Stated, BMI: 44.2, Temperature: 97.7 F, Pulse: 85 bpm, Respiratory Rate: 18 breaths/min, Blood Pressure: 116/75 mmHg. Eyes conjunctiva clear no eyelid edema noted. pupils equal round and reactive to light and accommodation. Ears, Nose, Mouth, and Throat no gross abnormality of ear auricles or external auditory canals. normal hearing noted during conversation. mucus membranes moist. Respiratory normal breathing without difficulty. Musculoskeletal normal gait and posture. no significant deformity or arthritic changes, no loss or range of motion, no clubbing. Psychiatric this patient is able to make  decisions and demonstrates good insight into disease process. Alert and Oriented x 3. pleasant and cooperative. General Notes: Upon inspection patient's wound bed actually showed signs of fairly good granulation epithelization at this point. I do not see any signs of active infection locally nor systemically which is great news and overall I am extremely pleased with where we stand currently. I do believe that he is actually doing quite well with this has been using gentamicin cream topically. [name] [name], [name] [name] ([id number]) [id number] Page 5 of 7 Integumentary (Hair, Skin) Wound #1 status is Open. Original cause of wound was Trauma. The date acquired was: 01/11/2023. The wound is located on the Right Hand - Palm. The wound measures 2cm length x 1.6cm width x 0.3cm depth; 2.513cm^2 area and 0.754cm^3 volume. There is Fat Layer (Subcutaneous Tissue) exposed. There is no tunneling or undermining noted. There is a medium amount of serosanguineous drainage noted. There is large (67-100%) red, pink granulation within the wound  bed. There is a small (1-33%) amount of necrotic tissue within the wound bed including Adherent Slough. Assessment Active Problems ICD-10 Laceration without foreign body of right hand, initial encounter Non-pressure chronic ulcer of skin of other sites with fat layer exposed Essential (primary) hypertension Type 2 diabetes mellitus with other skin ulcer Plan Follow-up Appointments: Return Appointment in 1 week. Bathing/ Shower/ Hygiene: May shower; gently cleanse wound with antibacterial soap, rinse and pat dry prior to dressing wounds No tub bath. - no soaking hand in water Anesthetic (Use 'Patient Medications' Section for Anesthetic Order Entry): Lidocaine applied to wound bed Medications-Please add to medication list.: P.O. Antibiotics - continue taking your prescribed antibiotic till finished Topical Antibiotic - continue using mupirocin WOUND  #1: - Hand - Palm Wound Laterality: Right Cleanser: Byram Ancillary Kit - 15 Day Supply (DME) (Generic) 3 x Per Week/30 Days Discharge Instructions: Use supplies as instructed; Kit contains: (15) Saline Bullets; (15) 3x3 Gauze; 15 pr Gloves Cleanser: Soap and Water 3 x Per Week/30 Days Discharge Instructions: Gently cleanse wound with antibacterial soap, rinse and pat dry prior to dressing wounds Topical: Mupirocin Ointment 3 x Per Week/30 Days Discharge Instructions: Apply as directed by provider. THIN LAYER Prim Dressing: Prisma 4.34 (in) 3 x Per Week/30 Days ary Discharge Instructions: Moisten w/normal saline or sterile water; Cover wound as directed. Do not remove from wound bed. Secondary Dressing: Non-Adherent Pad 3x8 (in/in) 3 x Per Week/30 Days Discharge Instructions: cut small square to cover wound Secured With: T egaderm Film 4x4 (in/in) 3 x Per Week/30 Days Discharge Instructions: Apply to wound bed 1. I would recommend that we have the patient continue to monitor for any signs of infection or worsening. Based on what I am seeing I do think that he is making excellent progress. I think that we should switch over to a collagen based dressing I believe this will help him heal much more effectively and quickly. 2. I am also can recommend that he use a little bit just a thin film of the gentamicin down first. 3. And also can recommend that he should continue with the T egaderm and nonadherent pad although he can use next care bandages which are essentially the same thing that should help with meds is waterproof and this is on his hand. We will see patient back for reevaluation in 1 week here in the clinic. If anything worsens or changes patient will contact our office for additional recommendations. Electronic Signature(s) Signed: 01/15/2023 4:29:13 PM By: Allen Derry PA-C Entered By: Allen Derry on 01/15/2023  16:29:13 -------------------------------------------------------------------------------- ROS/PFSH Details Patient Name: Date of Service: [name] [name]. 01/15/2023 3:00 PM Medical Record Number: [id number] Patient Account Number: [id number] Date of Birth/Sex: Treating RN: June 30, 1951 (72 y.o. [name] [name] Primary Care Provider: Rolin Barry Other Clinician: Referring Provider: Treating Provider/Extender: Quin Hoop in Treatment: 0 Information Obtained From Patient Constitutional Symptoms [facility]) [name] [name], [name] [name] ([id number]) [id number] Page 6 of 7 Complaints and Symptoms: Negative for: Fatigue; Fever; Chills; Marked Weight Change Eyes Complaints and Symptoms: Positive for: Glasses / Contacts Ear/Nose/Mouth/Throat Complaints and Symptoms: Negative for: Difficult clearing ears; Sinusitis Hematologic/Lymphatic Complaints and Symptoms: Negative for: Bleeding / Clotting Disorders; Human Immunodeficiency Virus Respiratory Complaints and Symptoms: Negative for: Chronic or frequent coughs; Shortness of Breath Gastrointestinal Complaints and Symptoms: Negative for: Frequent diarrhea; Nausea; Vomiting Genitourinary Complaints and Symptoms: Negative for: Kidney failure/ Dialysis; Incontinence/dribbling Immunological Complaints and Symptoms: Positive for: Hives Negative for: Itching Integumentary (Skin) Complaints and Symptoms:  Negative for: Wounds; Bleeding or bruising tendency; Breakdown; Swelling Neurologic Complaints and Symptoms: Positive for: Numbness/parasthesias - bilat feet Psychiatric Complaints and Symptoms: Negative for: Anxiety; Claustrophobia Cardiovascular Medical History: Positive for: Hypertension Endocrine Medical History: Positive for: Type II Diabetes Time with diabetes: 20 years Treated with: Oral agents Blood sugar tested every day: No Musculoskeletal Complaints and Symptoms: Review of  System Notes: bilateral knee replacement Medical History: Positive for: Gout Past Medical History Notes: arthritis [name] [name], [name] [name] ([id number]) [id number] Page 7 of 7 Oncologic Complaints and Symptoms: Review of System Notes: skin CA Immunizations Pneumococcal Vaccine: Received Pneumococcal Vaccination: Yes Received Pneumococcal Vaccination On or After 60th Birthday: Yes Implantable Devices None Family and Social History Former smoker - quit 27 years; Alcohol Use: Never - previous; Drug Use: Current History - weed rarely Electronic Signature(s) Signed: 01/15/2023 5:07:08 PM By: Angelina Pih Signed: 01/15/2023 10:26:23 PM By: Allen Derry PA-C Entered By: Angelina Pih on 01/15/2023 15:21:45 -------------------------------------------------------------------------------- SuperBill Details Patient Name: Date of Service: [name] [name]. 01/15/2023 Medical Record Number: [id number] Patient Account Number: [id number] Date of Birth/Sex: Treating RN: August 10, 1951 (72 y.o. [name] [name] Primary Care Provider: Rolin Barry Other Clinician: Referring Provider: Treating Provider/Extender: Quin Hoop in Treatment: 0 Diagnosis Coding ICD-10 Codes Code Description [phone] Laceration without foreign body of right hand, initial encounter L98.492 Non-pressure chronic ulcer of skin of other sites with fat layer exposed I10 Essential (primary) hypertension E11.622 Type 2 diabetes mellitus with other skin ulcer Facility Procedures : CPT4 Code: [id number] Description: 99214 - WOUND CARE VISIT-LEV 4 EST PT Modifier: Quantity: 1 Physician Procedures : CPT4 Code Description Modifier [id number] WC PHYS LEVEL 3 NEW PT ICD-10 Diagnosis Description S61.411A Laceration without foreign body of right hand, initial encounter L98.492 Non-pressure chronic ulcer of skin of other sites with fat layer exposed I10  Essential (primary) hypertension E11.622  Type 2 diabetes mellitus with other skin ulcer Quantity: 1 Electronic Signature(s) Signed: 01/15/2023 4:29:39 PM By: Allen Derry PA-C Entered By: Allen Derry on 01/15/2023 16:29:39

## 2023-01-22 ENCOUNTER — Encounter: Payer: Medicare PPO | Admitting: Physician Assistant

## 2023-01-22 DIAGNOSIS — S61411A Laceration without foreign body of right hand, initial encounter: Secondary | ICD-10-CM | POA: Diagnosis not present

## 2023-01-22 NOTE — Progress Notes (Addendum)
[name] [name] ([id number]) [id number] Page 1 of 5 Visit Report for 01/22/2023 Chief Complaint Document Details Patient Name: Date of Service: [name], [name] 01/22/2023 3:15 PM Medical Record Number: [id number] Patient Account Number: [id number] Date of Birth/Sex: Treating [name]: 07/18/1951 (72 y.o. [name] [name]) [name] [name] Primary Care Provider: Rolin [name] Other Clinician: Referring Provider: Treating Provider/Extender: [name] [name] in Treatment: 1 Information Obtained from: Patient Chief Complaint Right hand laceration Electronic Signature(s) Signed: 01/22/2023 3:10:17 PM By: [name] Derry PA-C Entered By: [name] [name] on 01/22/2023 15:10:17 -------------------------------------------------------------------------------- HPI Details Patient Name: Date of Service: [name], [name] 01/22/2023 3:15 PM Medical Record Number: [id number] Patient Account Number: [id number] Date of Birth/Sex: Treating [name]: [birth year] (72 y.o. [name] [name] Primary Care Provider: Rolin [name] Other Clinician: Referring Provider: Treating Provider/Extender: [name] [name] in Treatment: 1 History of Present Illness HPI Description: 01-15-2023 upon evaluation today patient appears to be doing poorly currently in regard to a wound on his hand. This is actually his right hand right in the middle he was put together a chair on Sunday this past week that was 7 April when he pushed down on a piece putting it together and it pinched his hand and tore some of the tissue out of the middle part of his palm. Fortunately I do not see any signs of active infection systemically nor locally which is great news. Unfortunately he does have a fairly deep wound that goes down towards the muscle layer and is going to take a little bit of time to heal for sure. Patient does have a history of hypertension and diabetes mellitus type 2 but otherwise no major medical problems. 01-22-2023  upon evaluation today patient appears to be doing well currently in regard to the wound on his hand. I think we are seeing signs of this contracting and being a little bit smaller which is great news already. Fortunately there does not appear to be any signs of active infection locally or systemically which is great news and I am very pleased in that regard. Overall I think he is moving in the right direction. Electronic Signature(s) Signed: 01/23/2023 9:55:30 AM By: [name] Derry PA-C Entered By: [name] [name] on 01/23/2023 09:55:30 -------------------------------------------------------------------------------- Physical Exam Details Patient Name: Date of Service: [name], [name] 01/22/2023 3:15 PM Medical Record Number: [id number] Patient Account Number: [id number] Date of Birth/Sex: Treating [name]: 05/31/1951 (72 y.o. [name] [name] Primary Care Provider: Rolin [name] Other Clinician: Referring Provider: Treating Provider/Extender: [name] [name] in Treatment: 1 Constitutional Well-nourished and well-hydrated in no acute distress. Respiratory normal breathing without difficulty. Psychiatric [name], [name] ([id number]) [id number] Page 2 of 5 this patient is able to make decisions and demonstrates good insight into disease process. Alert and Oriented x 3. pleasant and cooperative. Notes Upon inspection patient's wound bed actually showed signs of good granulation epithelization at this point. Fortunately there does not appear to be any signs of active infection which is great news and in general I do think that we are on the right track here. Electronic Signature(s) Signed: 01/23/2023 9:55:55 AM By: [name] Derry PA-C Entered By: [name] [name] on 01/23/2023 09:55:54 -------------------------------------------------------------------------------- Physician Orders Details Patient Name: Date of Service: [name], [name] 01/22/2023 3:15 PM Medical Record  Number: [id number] Patient Account Number: [id number] Date of Birth/Sex: Treating [name]: Aug 02, 1951 (72 y.o. [name] [name] Primary Care Provider: Rolin [name] Other Clinician: Referring Provider: Treating Provider/Extender: [name] [name] in Treatment: 1 Verbal / Phone Orders:  No Diagnosis Coding ICD-10 Coding Code Description S61.411A Laceration without foreign body of right hand, initial encounter L98.492 Non-pressure chronic ulcer of skin of other sites with fat layer exposed I10 Essential (primary) hypertension E11.622 Type 2 diabetes mellitus with other skin ulcer Follow-up Appointments Return Appointment in 2 weeks. Bathing/ Shower/ Hygiene May shower; gently cleanse wound with antibacterial soap, rinse and pat dry prior to dressing wounds No tub bath. - no soaking hand in water Anesthetic (Use 'Patient Medications' Section for Anesthetic Order Entry) Lidocaine applied to wound bed Wound Treatment Wound #1 - Hand - Palm Wound Laterality: Right Cleanser: Byram Ancillary Kit - 15 Day Supply (Generic) 3 x Per Week/30 Days Discharge Instructions: Use supplies as instructed; Kit contains: (15) Saline Bullets; (15) 3x3 Gauze; 15 pr Gloves Cleanser: Soap and Water 3 x Per Week/30 Days Discharge Instructions: Gently cleanse wound with antibacterial soap, rinse and pat dry prior to dressing wounds Prim Dressing: Prisma 4.34 (in) 3 x Per Week/30 Days ary Discharge Instructions: Moisten w/normal saline or sterile water; Cover wound as directed. Do not remove from wound bed. Secondary Dressing: Coverlet Latex-Free Fabric Adhesive Dressings 3 x Per Week/30 Days Discharge Instructions: 1.5 x 2 Electronic Signature(s) Signed: 01/22/2023 3:56:10 PM By: [name] [name] Signed: 01/23/2023 1:44:50 PM By: [name] Derry PA-C Entered By: [name] [name] on 01/22/2023 15:56:10 -------------------------------------------------------------------------------- Problem List Details Patient  Name: Date of Service: [name] [name]. 01/22/2023 3:15 PM Medical Record Number: [id number] Patient Account Number: [id number] Date of Birth/Sex: Treating [name]: [birth year] (72 y.o. [name] [name] Primary Care Provider: Rolin [name] Other Clinician: Referring Provider: Treating Provider/Extender: [name] [name] in Treatment: 1 [name], [name] [name] ([id number]) [id number] Page 3 of 5 Active Problems ICD-10 Encounter Code Description Active Date MDM Diagnosis S61.411A Laceration without foreign body of right hand, initial encounter 01/15/2023 No Yes L98.492 Non-pressure chronic ulcer of skin of other sites with fat layer exposed 01/15/2023 No Yes I10 Essential (primary) hypertension 01/15/2023 No Yes E11.622 Type 2 diabetes mellitus with other skin ulcer 01/15/2023 No Yes Inactive Problems Resolved Problems Electronic Signature(s) Signed: 01/22/2023 3:10:12 PM By: [name] Derry PA-C Entered By: [name] [name] on 01/22/2023 15:10:12 -------------------------------------------------------------------------------- Progress Note Details Patient Name: Date of Service: [name] [name]. 01/22/2023 3:15 PM Medical Record Number: [id number] Patient Account Number: [id number] Date of Birth/Sex: Treating [name]: 04-01-1951 (71 y.o. [name] [name]) [name] [name] Primary Care Provider: Rolin [name] Other Clinician: Referring Provider: Treating Provider/Extender: [name] [name] in Treatment: 1 Subjective Chief Complaint Information obtained from Patient Right hand laceration History of Present Illness (HPI) 01-15-2023 upon evaluation today patient appears to be doing poorly currently in regard to a wound on his hand. This is actually his right hand right in the middle he was put together a chair on Sunday this past week that was 7 April when he pushed down on a piece putting it together and it pinched his hand and tore some of the tissue out of the middle part of his  palm. Fortunately I do not see any signs of active infection systemically nor locally which is great news. Unfortunately he does have a fairly deep wound that goes down towards the muscle layer and is going to take a little bit of time to heal for sure. Patient does have a history of hypertension and diabetes mellitus type 2 but otherwise no major medical problems. 01-22-2023 upon evaluation today patient appears to be doing well currently in regard to the wound on his hand. I think we  are seeing signs of this contracting and being a little bit smaller which is great news already. Fortunately there does not appear to be any signs of active infection locally or systemically which is great news and I am very pleased in that regard. Overall I think he is moving in the right direction. Objective Constitutional Well-nourished and well-hydrated in no acute distress. Vitals Time Taken: 3:31 PM, Height: 68 in, Weight: 291 lbs, BMI: 44.2, Temperature: 98.5 F, Pulse: 101 bpm, Respiratory Rate: 18 breaths/min, Blood Pressure: 116/79 mmHg. [name], [name] ([id number]) [id number] Page 4 of 5 Respiratory normal breathing without difficulty. Psychiatric this patient is able to make decisions and demonstrates good insight into disease process. Alert and Oriented x 3. pleasant and cooperative. General Notes: Upon inspection patient's wound bed actually showed signs of good granulation epithelization at this point. Fortunately there does not appear to be any signs of active infection which is great news and in general I do think that we are on the right track here. Integumentary (Hair, Skin) Wound #1 status is Open. Original cause of wound was Trauma. The date acquired was: 01/11/2023. The wound has been in treatment 1 weeks. The wound is located on the Right Hand - Palm. The wound measures 1.5cm length x 1.5cm width x 0.3cm depth; 1.767cm^2 area and 0.53cm^3 volume. There is Fat  Layer (Subcutaneous Tissue) exposed. There is no tunneling or undermining noted. There is a medium amount of serosanguineous drainage noted. There is large (67- 100%) red, pink granulation within the wound bed. There is a small (1-33%) amount of necrotic tissue within the wound bed including Adherent Slough. Assessment Active Problems ICD-10 Laceration without foreign body of right hand, initial encounter Non-pressure chronic ulcer of skin of other sites with fat layer exposed Essential (primary) hypertension Type 2 diabetes mellitus with other skin ulcer Plan Follow-up Appointments: Return Appointment in 2 weeks. Bathing/ Shower/ Hygiene: May shower; gently cleanse wound with antibacterial soap, rinse and pat dry prior to dressing wounds No tub bath. - no soaking hand in water Anesthetic (Use 'Patient Medications' Section for Anesthetic Order Entry): Lidocaine applied to wound bed WOUND #1: - Hand - Palm Wound Laterality: Right Cleanser: Byram Ancillary Kit - 15 Day Supply (Generic) 3 x Per Week/30 Days Discharge Instructions: Use supplies as instructed; Kit contains: (15) Saline Bullets; (15) 3x3 Gauze; 15 pr Gloves Cleanser: Soap and Water 3 x Per Week/30 Days Discharge Instructions: Gently cleanse wound with antibacterial soap, rinse and pat dry prior to dressing wounds Prim Dressing: Prisma 4.34 (in) 3 x Per Week/30 Days ary Discharge Instructions: Moisten w/normal saline or sterile water; Cover wound as directed. Do not remove from wound bed. Secondary Dressing: Coverlet Latex-Free Fabric Adhesive Dressings 3 x Per Week/30 Days Discharge Instructions: 1.5 x 2 1. I would recommend currently based on what we are seeing that we go ahead and have the patient continue with the collagen I think this is probably still to be the best way to go currently. 2. I am also can recommend the patient should continue to use the waterproof bandages which I think are doing a pretty good job as well  here. We will see patient back for reevaluation in 1 week here in the clinic. If anything worsens or changes patient will contact our office for additional recommendations. Electronic Signature(s) Signed: 01/23/2023 10:22:52 AM By: [name] Derry PA-C Entered By: [name] [name] on 01/23/2023 10:22:52 -------------------------------------------------------------------------------- SuperBill Details Patient Name: Date of Service: [name] [name]. 01/22/2023 Medical Record Number:  [id number] Patient Account Number: [id number] Date of Birth/Sex: Treating [name]: 1950/11/02 (71 y.o. [name] [name]) [name] [name] Primary Care Provider: Rolin [name] Other Clinician: Referring Provider: Treating Provider/Extender: [name] [name] in Treatment: 1 Diagnosis Coding [name] [name], [name] [name] ([id number]) [id number] Page 5 of 5 ICD-10 Codes Code Description [id number] Laceration without foreign body of right hand, initial encounter L98.492 Non-pressure chronic ulcer of skin of other sites with fat layer exposed I10 Essential (primary) hypertension E11.622 Type 2 diabetes mellitus with other skin ulcer Facility Procedures : CPT4 Code: [id number] Description: 46962 - WOUND CARE VISIT-LEV 2 EST PT Modifier: Quantity: 1 Physician Procedures : CPT4 Code Description Modifier [id number] - WC PHYS LEVEL 3 - EST PT ICD-10 Diagnosis Description S61.411A Laceration without foreign body of right hand, initial encounter L98.492 Non-pressure chronic ulcer of skin of other sites with fat layer  exposed I10 Essential (primary) hypertension E11.622 Type 2 diabetes mellitus with other skin ulcer Quantity: 1 Electronic Signature(s) Signed: 01/23/2023 10:23:05 AM By: [name] Derry PA-C Previous Signature: 01/22/2023 3:54:36 PM Version By: [name] [name] Entered By: [name] [name] on 01/23/2023 10:23:05

## 2023-01-22 NOTE — Progress Notes (Addendum)
[name] [name], [name] [name] ([id number]) [id number] Page 1 of 7 Visit Report for 01/22/2023 Arrival Information Details Patient Name: Date of Service: [name] [name], [name] [name] 01/22/2023 3:15 PM Medical Record Number: [id number] Patient Account Number: [id number] Date of Birth/Sex: Treating RN: 1951/02/03 (71 y.o. Judie Petit) Yevonne Pax Primary Care Kamiryn Bezanson: Rolin Barry Other Clinician: Referring Aleane Wesenberg: Treating Dionisio Aragones/Extender: Vanessa Ralphs in Treatment: 1 Visit Information History Since Last Visit Added or deleted any medications: No Patient Arrived: Ambulatory Any new allergies or adverse reactions: No Arrival Time: 15:25 Had a fall or experienced change in No Accompanied By: self activities of daily living that may affect Transfer Assistance: None risk of falls: Patient Identification Verified: Yes Signs or symptoms of abuse/neglect since last visito No Secondary Verification Process Completed: Yes Hospitalized since last visit: No Patient Has Alerts: Yes Implantable device outside of the clinic excluding No Patient Alerts: Patient on Blood Thinner cellular tissue based products placed in the center type 2 diabetic since last visit: Has Dressing in Place as Prescribed: Yes Pain Present Now: No Electronic Signature(s) Signed: 01/22/2023 4:49:33 PM By: Yevonne Pax RN Entered By: Yevonne Pax on 01/22/2023 15:31:05 -------------------------------------------------------------------------------- Clinic Level of Care Assessment Details Patient Name: Date of Service: [name] [name], [name] [name] 01/22/2023 3:15 PM Medical Record Number: [id number] Patient Account Number: [id number] Date of Birth/Sex: Treating RN: 1950/10/19 (71 y.o. Judie Petit) Yevonne Pax Primary Care Raelin Pixler: Rolin Barry Other Clinician: Referring Aurther Harlin: Treating Adah Stoneberg/Extender: Vanessa Ralphs in Treatment: 1 Clinic Level of Care Assessment Items TOOL 4 Quantity Score X- 1 0 Use  when only an EandM is performed on FOLLOW-UP visit ASSESSMENTS - Nursing Assessment / Reassessment X- 1 10 Reassessment of Co-morbidities (includes updates in patient status) X- 1 5 Reassessment of Adherence to Treatment Plan ASSESSMENTS - Wound and Skin A ssessment / Reassessment X - Simple Wound Assessment / Reassessment - one wound 1 5  - 0 Complex Wound Assessment / Reassessment - multiple wounds  - 0 Dermatologic / Skin Assessment (not related to wound area) ASSESSMENTS - Focused Assessment  - 0 Circumferential Edema Measurements - multi extremities  - 0 Nutritional Assessment / Counseling / Intervention  - 0 Lower Extremity Assessment (monofilament, tuning fork, pulses)  - 0 Peripheral Arterial Disease Assessment (using hand held doppler) ASSESSMENTS - Ostomy and/or Continence Assessment and Care  - 0 Incontinence Assessment and Management  - 0 Ostomy Care Assessment and Management (repouching, etc.) PROCESS - Coordination of Care X - Simple Patient / Family Education for ongoing care 1 15 [name] [name], [name] [name] ([id number]) [id number] Page 2 of 7  - 0 Complex (extensive) Patient / Family Education for ongoing care  - 0 Staff obtains Chiropractor, Records, T Results / Process Orders est  - 0 Staff telephones HHA, Nursing Homes / Clarify orders / etc  - 0 Routine Transfer to another Facility (non-emergent condition)  - 0 Routine Hospital Admission (non-emergent condition)  - 0 New Admissions / Manufacturing engineer / Ordering NPWT Apligraf, etc. ,  - 0 Emergency Hospital Admission (emergent condition) X- 1 10 Simple Discharge Coordination  - 0 Complex (extensive) Discharge Coordination PROCESS - Special Needs  - 0 Pediatric / Minor Patient Management  - 0 Isolation Patient Management  - 0 Hearing / Language / Visual special needs  - 0 Assessment of Community assistance (transportation, D/C planning,  etc.)  - 0 Additional assistance / Altered mentation  - 0 Support Surface(s) Assessment (bed, cushion, seat, etc.) INTERVENTIONS - Wound Cleansing / Measurement X - Simple Wound  Cleansing - one wound 1 5  - 0 Complex Wound Cleansing - multiple wounds X- 1 5 Wound Imaging (photographs - any number of wounds)  - 0 Wound Tracing (instead of photographs) X- 1 5 Simple Wound Measurement - one wound  - 0 Complex Wound Measurement - multiple wounds INTERVENTIONS - Wound Dressings X - Small Wound Dressing one or multiple wounds 1 10  - 0 Medium Wound Dressing one or multiple wounds  - 0 Large Wound Dressing one or multiple wounds  - 0 Application of Medications - topical  - 0 Application of Medications - injection INTERVENTIONS - Miscellaneous  - 0 External ear exam  - 0 Specimen Collection (cultures, biopsies, blood, body fluids, etc.)  - 0 Specimen(s) / Culture(s) sent or taken to Lab for analysis  - 0 Patient Transfer (multiple staff / Nurse, adult / Similar devices)  - 0 Simple Staple / Suture removal (25 or less)  - 0 Complex Staple / Suture removal (26 or more)  - 0 Hypo / Hyperglycemic Management (close monitor of Blood Glucose)  - 0 Ankle / Brachial Index (ABI) - do not check if billed separately X- 1 5 Vital Signs Has the patient been seen at the hospital within the last three years: Yes Total Score: 75 Level Of Care: New/Established - Level 2 Electronic Signature(s) Signed: 01/22/2023 4:49:33 PM By: Yevonne Pax RN Entered By: Yevonne Pax on 01/22/2023 15:54:29 Chenault, Landis Martins ([id number]) [id number] Page 3 of 7 -------------------------------------------------------------------------------- Encounter Discharge Information Details Patient Name: Date of Service: [name] [name], [name] [name] 01/22/2023 3:15 PM Medical Record Number: [id number] Patient Account Number: [id number] Date of Birth/Sex: Treating  RN: June 16, 1951 (71 y.o. Judie Petit) Yevonne Pax Primary Care Taejon Irani: Rolin Barry Other Clinician: Referring Noeli Lavery: Treating Djimon Lundstrom/Extender: Vanessa Ralphs in Treatment: 1 Encounter Discharge Information Items Discharge Condition: Stable Ambulatory Status: Ambulatory Discharge Destination: Home Transportation: Private Auto Accompanied By: self Schedule Follow-up Appointment: Yes Clinical Summary of Care: Electronic Signature(s) Signed: 01/22/2023 3:55:14 PM By: Yevonne Pax RN Entered By: Yevonne Pax on 01/22/2023 15:55:13 -------------------------------------------------------------------------------- Lower Extremity Assessment Details Patient Name: Date of Service: [name] [name], [name] [name] 01/22/2023 3:15 PM Medical Record Number: [id number] Patient Account Number: [id number] Date of Birth/Sex: Treating RN: [birth year] (71 y.o. Judie Petit) Yevonne Pax Primary Care Aviannah Castoro: Rolin Barry Other Clinician: Referring Kizzy Olafson: Treating Natasha Burda/Extender: Willaim Rayas Weeks in Treatment: 1 Electronic Signature(s) Signed: 01/22/2023 4:49:33 PM By: Yevonne Pax RN Entered By: Yevonne Pax on 01/22/2023 15:32:37 -------------------------------------------------------------------------------- Multi Wound Chart Details Patient Name: Date of Service: [name] Milan. 01/22/2023 3:15 PM Medical Record Number: [id number] Patient Account Number: [id number] Date of Birth/Sex: Treating RN: 10-25-1950 (71 y.o. Judie Petit) Yevonne Pax Primary Care Allexis Bordenave: Rolin Barry Other Clinician: Referring Damaria Vachon: Treating Nahmir Zeidman/Extender: Vanessa Ralphs in Treatment: 1 Vital Signs Height(in): 68 Pulse(bpm): 101 Weight(lbs): 291 Blood Pressure(mmHg): 116/79 Body Mass Index(BMI): 44.2 Temperature(F): 98.5 Respiratory Rate(breaths/min): 18 [1:Photos:] [N/A:N/A] [name] [name], [name] [name] ([id number]) [1:Right Hand - Palm Wound Location: Trauma Wounding Event: Trauma, Other Primary  Etiology: Hypertension, Type II Diabetes, Gout N/A Comorbid History: 01/11/2023 Date Acquired: 1 Weeks of Treatment: Open Wound Status: No Wound Recurrence: 1.5x1.5x0.3  Measurements L x W x D (cm) 1.767 A (cm) : rea 0.53 Volume (cm) : 29.70% % Reduction in A rea: 29.70% % Reduction in Volume: Full Thickness With Exposed Support N/A Classification: Structures Medium Exudate A mount: Serosanguineous Exudate Type: red,  brown Exudate Color: Large (67-100%) Granulation A mount: Red, Pink Granulation Quality: Small (1-33%) Necrotic A  mount: Fat Layer (Subcutaneous Tissue): Yes N/A Exposed Structures: None Epithelialization:] [N/A:N/A N/A N/A N/A N/A N/A N/A N/A N/A N/A  N/A N/A N/A N/A N/A N/A N/A N/A N/A] Treatment Notes Electronic Signature(s) Signed: 01/22/2023 4:49:33 PM By: Yevonne Pax RN Entered By: Yevonne Pax on 01/22/2023 15:32:42 -------------------------------------------------------------------------------- Multi-Disciplinary Care Plan Details Patient Name: Date of Service: [name] Milan. 01/22/2023 3:15 PM Medical Record Number: [id number] Patient Account Number: [id number] Date of Birth/Sex: Treating RN: [birth year] (71 y.o. Melonie [place] Primary Care Kha Hari: Rolin Barry Other Clinician: Referring Jensyn Shave: Treating Alois Mincer/Extender: Vanessa Ralphs in Treatment: 1 Active Inactive Wound/Skin Impairment Nursing Diagnoses: Impaired tissue integrity Knowledge deficit related to ulceration/compromised skin integrity Goals: Ulcer/skin breakdown will have a volume reduction of 30% by week 4 Date Initiated: 01/15/2023 Target Resolution Date: 02/12/2023 Goal Status: Active Ulcer/skin breakdown will have a volume reduction of 50% by week 8 Date Initiated: 01/15/2023 Target Resolution Date: 03/12/2023 Goal Status: Active Ulcer/skin breakdown will have a volume reduction of 80% by week 12 Date Initiated: 01/15/2023 Target Resolution Date: 04/09/2023 Goal Status:  Active Ulcer/skin breakdown will heal within 14 weeks Date Initiated: 01/15/2023 Target Resolution Date: 04/23/2023 Goal Status: Active Interventions: Assess patient/caregiver ability to obtain necessary supplies Assess patient/caregiver ability to perform ulcer/skin care regimen upon admission and as needed Assess ulceration(s) every visit Provide education on ulcer and skin care Treatment Activities: Skin care regimen initiated : 01/15/2023 Topical wound management initiated : 01/15/2023 [name] [name], [name] [name] ([id number]) [id number] Page 5 of 7 Notes: Electronic Signature(s) Signed: 01/22/2023 4:49:33 PM By: Yevonne Pax RN Entered By: Yevonne Pax on 01/22/2023 15:32:56 -------------------------------------------------------------------------------- Pain Assessment Details Patient Name: Date of Service: [name] [name], [name] [name] 01/22/2023 3:15 PM Medical Record Number: [id number] Patient Account Number: [id number] Date of Birth/Sex: Treating RN: 02-27-1951 (71 y.o. Judie Petit) Yevonne Pax Primary Care Jayion Schneck: Rolin Barry Other Clinician: Referring Ismael Treptow: Treating Kayliee Atienza/Extender: Vanessa Ralphs in Treatment: 1 Active Problems Location of Pain Severity and Description of Pain Patient Has Paino No Site Locations Pain Management and Medication Current Pain Management: Electronic Signature(s) Signed: 01/22/2023 4:49:33 PM By: Yevonne Pax RN Entered By: Yevonne Pax on 01/22/2023 15:31:42 -------------------------------------------------------------------------------- Patient/Caregiver Education Details Patient Name: Date of Service: [name] Milan 4/18/2024andnbsp3:15 PM Medical Record Number: [id number] Patient Account Number: [id number] Date of Birth/Gender: Treating RN: 1951-08-14 (71 y.o. Judie Petit) Yevonne Pax Primary Care Physician: Rolin Barry Other Clinician: Referring Physician: Treating Physician/Extender: Vanessa Ralphs in  Treatment: 1 Education Assessment Education Provided To: Patient Education Topics Provided Wound/Skin Impairment: Handouts: Caring for Your Ulcer Methods: Explain/Verbal Responses: State content correctly [name] [name], [name] [name] ([id number]) [id number] Page 6 of 7 Electronic Signature(s) Signed: 01/22/2023 4:49:33 PM By: Yevonne Pax RN Entered By: Yevonne Pax on 01/22/2023 15:33:17 -------------------------------------------------------------------------------- Wound Assessment Details Patient Name: Date of Service: [name] [name], [name] [name] 01/22/2023 3:15 PM Medical Record Number: [id number] Patient Account Number: [id number] Date of Birth/Sex: Treating RN: Aug 17, 1951 (71 y.o. Judie Petit) Yevonne Pax Primary Care Sherl Yzaguirre: Rolin Barry Other Clinician: Referring Abilene Mcphee: Treating Myriah Boggus/Extender: Vanessa Ralphs in Treatment: 1 Wound Status Wound Number: 1 Primary Etiology: Trauma, Other Wound Location: Right Hand - Palm Wound Status: Open Wounding Event: Trauma Comorbid History: Hypertension, Type II Diabetes, Gout Date Acquired: 01/11/2023 Weeks Of Treatment: 1 Clustered Wound: No Photos Wound Measurements Length: (cm) 1.5 Width: (cm) 1.5 Depth: (cm) 0.3 Area: (cm) 1.767 Volume: (cm) 0.53 % Reduction in Area: 29.7% % Reduction in Volume: 29.7% Epithelialization: None Tunneling: No Undermining: No Wound Description Classification:  Full Thickness With Exposed Suppo Exudate Amount: Medium Exudate Type: Serosanguineous Exudate Color: red, brown rt Structures Foul Odor After Cleansing: No Slough/Fibrino Yes Wound Bed Granulation Amount: Large (67-100%) Exposed Structure Granulation Quality: Red, Pink Fat Layer (Subcutaneous Tissue) Exposed: Yes Necrotic Amount: Small (1-33%) Necrotic Quality: Adherent Slough Treatment Notes Wound #1 (Hand - Palm) Wound Laterality: Right Cleanser [place] Ancillary Kit - 15 Day Supply Discharge Instruction:  Use supplies as instructed; Kit contains: (15) Saline Bullets; (15) 3x3 Gauze; 15 pr Gloves Soap and Water Discharge Instruction: Gently cleanse wound with antibacterial soap, rinse and pat dry prior to dressing wounds Peri-Wound Care Topical [name] [name], [name] [name] ([id number]) [id number] Page 7 of 7 Primary Dressing Prisma 4.34 (in) Discharge Instruction: Moisten w/normal saline or sterile water; Cover wound as directed. Do not remove from wound bed. Secondary Dressing Coverlet Latex-Free Fabric Adhesive Dressings Discharge Instruction: 1.5 x 2 Secured With Compression Wrap Compression Stockings Add-Ons Electronic Signature(s) Signed: 01/22/2023 4:49:33 PM By: Yevonne Pax RN Entered By: Yevonne Pax on 01/22/2023 15:32:20 -------------------------------------------------------------------------------- Vitals Details Patient Name: Date of Service: [name] Milan. 01/22/2023 3:15 PM Medical Record Number: [id number] Patient Account Number: [id number] Date of Birth/Sex: Treating RN: [birth year] (71 y.o. Judie Petit) Yevonne Pax Primary Care Elpidia Karn: Rolin Barry Other Clinician: Referring Cicley Ganesh: Treating Tayte Childers/Extender: Vanessa Ralphs in Treatment: 1 Vital Signs Time Taken: 15:31 Temperature (F): 98.5 Height (in): 68 Pulse (bpm): 101 Weight (lbs): 291 Respiratory Rate (breaths/min): 18 Body Mass Index (BMI): 44.2 Blood Pressure (mmHg): 116/79 Reference Range: 80 - 120 mg / dl Electronic Signature(s) Signed: 01/22/2023 4:49:33 PM By: Yevonne Pax RN Entered By: Yevonne Pax on 01/22/2023 15:31:34

## 2023-02-02 ENCOUNTER — Ambulatory Visit: Payer: Medicare PPO | Admitting: Physician Assistant

## 2023-02-05 ENCOUNTER — Encounter: Payer: Medicare PPO | Attending: Physician Assistant | Admitting: Physician Assistant

## 2023-02-05 DIAGNOSIS — L98492 Non-pressure chronic ulcer of skin of other sites with fat layer exposed: Secondary | ICD-10-CM | POA: Diagnosis not present

## 2023-02-05 DIAGNOSIS — S61411A Laceration without foreign body of right hand, initial encounter: Secondary | ICD-10-CM | POA: Insufficient documentation

## 2023-02-05 DIAGNOSIS — E669 Obesity, unspecified: Secondary | ICD-10-CM | POA: Diagnosis not present

## 2023-02-05 DIAGNOSIS — I10 Essential (primary) hypertension: Secondary | ICD-10-CM | POA: Insufficient documentation

## 2023-02-05 DIAGNOSIS — Z6841 Body Mass Index (BMI) 40.0 and over, adult: Secondary | ICD-10-CM | POA: Insufficient documentation

## 2023-02-05 DIAGNOSIS — E1151 Type 2 diabetes mellitus with diabetic peripheral angiopathy without gangrene: Secondary | ICD-10-CM | POA: Diagnosis not present

## 2023-02-05 DIAGNOSIS — E11622 Type 2 diabetes mellitus with other skin ulcer: Secondary | ICD-10-CM | POA: Insufficient documentation

## 2023-02-06 NOTE — Progress Notes (Signed)
[name], [name] ([id number]) [id number] Page 1 of 7 Visit Report for 02/05/2023 Arrival Information Details Patient Name: Date of Service: [name] [name], [name] [name] 02/05/2023 2:00 PM Medical Record Number: [id number] Patient Account Number: [id number] Date of Birth/Sex: Treating RN: [birth year] (72 y.o. Laymond Purser Primary Care Angelice Piech: Rolin Barry Other Clinician: Referring Jaeshaun Riva: Treating Alizay Bronkema/Extender: Vanessa Ralphs in Treatment: 3 Visit Information History Since Last Visit Added or deleted any medications: No Patient Arrived: Ambulatory Any new allergies or adverse reactions: No Arrival Time: 13:54 Had a fall or experienced change in No Accompanied By: self activities of daily living that may affect Transfer Assistance: None risk of falls: Patient Identification Verified: Yes Hospitalized since last visit: No Secondary Verification Process Completed: Yes Has Dressing in Place as Prescribed: Yes Patient Has Alerts: Yes Pain Present Now: No Patient Alerts: Patient on Blood Thinner type 2 diabetic Electronic Signature(s) Signed: 02/05/2023 4:58:56 PM By: Angelina Pih Entered By: Angelina Pih on 02/05/2023 13:55:28 -------------------------------------------------------------------------------- Clinic Level of Care Assessment Details Patient Name: Date of Service: [name], [name] 02/05/2023 2:00 PM Medical Record Number: [id number] Patient Account Number: [id number] Date of Birth/Sex: Treating RN: 1951-02-27 (72 y.o. Laymond Purser Primary Care Kristof Nadeem: Rolin Barry Other Clinician: Referring Jacquese Hackman: Treating Elliett Guarisco/Extender: Vanessa Ralphs in Treatment: 3 Clinic Level of Care Assessment Items TOOL 4 Quantity Score []  - 0 Use when only an EandM is performed on FOLLOW-UP visit ASSESSMENTS - Nursing Assessment / Reassessment X- 1 10 Reassessment of Co-morbidities (includes updates in patient  status) X- 1 5 Reassessment of Adherence to Treatment Plan ASSESSMENTS - Wound and Skin A ssessment / Reassessment X - Simple Wound Assessment / Reassessment - one wound 1 5 []  - 0 Complex Wound Assessment / Reassessment - multiple wounds []  - 0 Dermatologic / Skin Assessment (not related to wound area) ASSESSMENTS - Focused Assessment []  - 0 Circumferential Edema Measurements - multi extremities []  - 0 Nutritional Assessment / Counseling / Intervention []  - 0 Lower Extremity Assessment (monofilament, tuning fork, pulses) []  - 0 Peripheral Arterial Disease Assessment (using hand held doppler) ASSESSMENTS - Ostomy and/or Continence Assessment and Care []  - 0 Incontinence Assessment and Management []  - 0 Ostomy Care Assessment and Management (repouching, etc.) PROCESS - Coordination of Care X - Simple Patient / Family Education for ongoing care 1 15 []  - 0 Complex (extensive) Patient / Family Education for ongoing care [name], [name] ([id number]) [id number] Page 2 of 7 X- 1 10 Staff obtains Consents, Records, T Results / Process Orders est []  - 0 Staff telephones HHA, Nursing Homes / Clarify orders / etc []  - 0 Routine Transfer to another Facility (non-emergent condition) []  - 0 Routine Hospital Admission (non-emergent condition) []  - 0 New Admissions / Manufacturing engineer / Ordering NPWT Apligraf, etc. , []  - 0 Emergency Hospital Admission (emergent condition) X- 1 10 Simple Discharge Coordination []  - 0 Complex (extensive) Discharge Coordination PROCESS - Special Needs []  - 0 Pediatric / Minor Patient Management []  - 0 Isolation Patient Management []  - 0 Hearing / Language / Visual special needs []  - 0 Assessment of Community assistance (transportation, D/C planning, etc.) []  - 0 Additional assistance / Altered mentation []  - 0 Support Surface(s) Assessment (bed, cushion, seat, etc.) INTERVENTIONS - Wound Cleansing /  Measurement X - Simple Wound Cleansing - one wound 1 5 []  - 0 Complex Wound Cleansing - multiple wounds X- 1 5 Wound Imaging (photographs - any number of wounds) []  - 0  Wound Tracing (instead of photographs) X- 1 5 Simple Wound Measurement - one wound []  - 0 Complex Wound Measurement - multiple wounds INTERVENTIONS - Wound Dressings X - Small Wound Dressing one or multiple wounds 1 10 []  - 0 Medium Wound Dressing one or multiple wounds []  - 0 Large Wound Dressing one or multiple wounds X- 1 5 Application of Medications - topical []  - 0 Application of Medications - injection INTERVENTIONS - Miscellaneous []  - 0 External ear exam []  - 0 Specimen Collection (cultures, biopsies, blood, body fluids, etc.) []  - 0 Specimen(s) / Culture(s) sent or taken to Lab for analysis []  - 0 Patient Transfer (multiple staff / Nurse, adult / Similar devices) []  - 0 Simple Staple / Suture removal (25 or less) []  - 0 Complex Staple / Suture removal (26 or more) []  - 0 Hypo / Hyperglycemic Management (close monitor of Blood Glucose) []  - 0 Ankle / Brachial Index (ABI) - do not check if billed separately X- 1 5 Vital Signs Has the patient been seen at the hospital within the last three years: Yes Total Score: 90 Level Of Care: New/Established - Level 3 Electronic Signature(s) Signed: 02/05/2023 4:58:56 PM By: Angelina Pih Entered By: Angelina Pih on 02/05/2023 14:28:49 [name] ([id number]) [id number] Page 3 of 7 -------------------------------------------------------------------------------- Encounter Discharge Information Details Patient Name: Date of Service: [name], [name] 02/05/2023 2:00 PM Medical Record Number: [id number] Patient Account Number: [id number] Date of Birth/Sex: Treating RN: Dec 23, 1950 (72 y.o. Laymond Purser Primary Care Rufus Cypert: Rolin Barry Other Clinician: Referring Mikalah Skyles: Treating Crystie Yanko/Extender: Vanessa Ralphs in Treatment: 3 Encounter Discharge Information Items Discharge Condition: Stable Ambulatory Status: Ambulatory Discharge Destination: Home Transportation: Private Auto Accompanied By: self Schedule Follow-up Appointment: Yes Clinical Summary of Care: Electronic Signature(s) Signed: 02/05/2023 2:31:01 PM By: Angelina Pih Entered By: Angelina Pih on 02/05/2023 14:31:01 -------------------------------------------------------------------------------- Lower Extremity Assessment Details Patient Name: Date of Service: BUCK, ILGENFRITZ 02/05/2023 2:00 PM Medical Record Number: [id number] Patient Account Number: [id number] Date of Birth/Sex: Treating RN: [birth year] (72 y.o. Laymond Purser Primary Care Vetra Shinall: Rolin Barry Other Clinician: Referring Jennah Satchell: Treating Maguire Sime/Extender: Vanessa Ralphs in Treatment: 3 Electronic Signature(s) Signed: 02/05/2023 4:58:56 PM By: Angelina Pih Entered By: Angelina Pih on 02/05/2023 14:04:57 -------------------------------------------------------------------------------- Multi Wound Chart Details Patient Name: Date of Service: [name] 02/05/2023 2:00 PM Medical Record Number: [id number] Patient Account Number: [id number] Date of Birth/Sex: Treating RN: 10-Apr-1951 (72 y.o. Laymond Purser Primary Care Sherwood Castilla: Rolin Barry Other Clinician: Referring Kiondre Grenz: Treating Morgaine Kimball/Extender: Vanessa Ralphs in Treatment: 3 Vital Signs Height(in): 68 Pulse(bpm): 74 Weight(lbs): 291 Blood Pressure(mmHg): 113/74 Body Mass Index(BMI): 44.2 Temperature(F): 97.9 Respiratory Rate(breaths/min): 18 [1:Photos:] [N/A:N/A] Right Hand - Palm N/A N/A Wound Location: Trauma N/A N/A Wounding Event: Trauma, Other N/A N/A Primary Etiology: BACARI, SHERRIN ([id number]) [id number] Page 4 of 7 Hypertension, Type II Diabetes, Gout N/A N/A Comorbid History: 01/11/2023  N/A N/A Date Acquired: 3 N/A N/A Weeks of Treatment: Open N/A N/A Wound Status: No N/A N/A Wound Recurrence: 1.8x0.8x0.2 N/A N/A Measurements L x W x D (cm) 1.131 N/A N/A A (cm) : rea 0.226 N/A N/A Volume (cm) : 55.00% N/A N/A % Reduction in Area: 70.00% N/A N/A % Reduction in Volume: Full Thickness With Exposed Support N/A N/A Classification: Structures Medium N/A N/A Exudate A mount: Serosanguineous N/A N/A Exudate Type: red, brown N/A N/A Exudate Color: Medium (34-66%) N/A N/A Granulation A mount: Red, Pink N/A N/A  Granulation Quality: Medium (34-66%) N/A N/A Necrotic A mount: Fat Layer (Subcutaneous Tissue): Yes N/A N/A Exposed Structures: None N/A N/A Epithelialization: Treatment Notes Electronic Signature(s) Signed: 02/05/2023 2:27:23 PM By: Angelina Pih Entered By: Angelina Pih on 02/05/2023 14:27:22 -------------------------------------------------------------------------------- Multi-Disciplinary Care Plan Details Patient Name: Date of Service: [name] 02/05/2023 2:00 PM Medical Record Number: [id number] Patient Account Number: [id number] Date of Birth/Sex: Treating RN: Nov 30, 1950 (72 y.o. Laymond Purser Primary Care Quenton Recendez: Rolin Barry Other Clinician: Referring Melroy Bougher: Treating Virginio Isidore/Extender: Vanessa Ralphs in Treatment: 3 Active Inactive Wound/Skin Impairment Nursing Diagnoses: Impaired tissue integrity Knowledge deficit related to ulceration/compromised skin integrity Goals: Ulcer/skin breakdown will have a volume reduction of 30% by week 4 Date Initiated: 01/15/2023 Target Resolution Date: 02/12/2023 Goal Status: Active Ulcer/skin breakdown will have a volume reduction of 50% by week 8 Date Initiated: 01/15/2023 Target Resolution Date: 03/12/2023 Goal Status: Active Ulcer/skin breakdown will have a volume reduction of 80% by week 12 Date Initiated: 01/15/2023 Target Resolution Date: 04/09/2023 Goal  Status: Active Ulcer/skin breakdown will heal within 14 weeks Date Initiated: 01/15/2023 Target Resolution Date: 04/23/2023 Goal Status: Active Interventions: Assess patient/caregiver ability to obtain necessary supplies Assess patient/caregiver ability to perform ulcer/skin care regimen upon admission and as needed Assess ulceration(s) every visit Provide education on ulcer and skin care Treatment Activities: Skin care regimen initiated : 01/15/2023 Topical wound management initiated : 01/15/2023 Notes: EARLINE, SALEEM ([id number]) [id number] Page 5 of 7 Electronic Signature(s) Signed: 02/05/2023 2:30:02 PM By: Angelina Pih Entered By: Angelina Pih on 02/05/2023 14:30:02 -------------------------------------------------------------------------------- Pain Assessment Details Patient Name: Date of Service: EYTHAN, BEBEAU 02/05/2023 2:00 PM Medical Record Number: [id number] Patient Account Number: [id number] Date of Birth/Sex: Treating RN: 08-23-1951 (72 y.o. Laymond Purser Primary Care Jocob Dambach: Rolin Barry Other Clinician: Referring Zandyr Barnhill: Treating Josephmichael Lisenbee/Extender: Vanessa Ralphs in Treatment: 3 Active Problems Location of Pain Severity and Description of Pain Patient Has Paino No Site Locations Rate the pain. Current Pain Level: 0 Pain Management and Medication Current Pain Management: Electronic Signature(s) Signed: 02/05/2023 4:58:56 PM By: Angelina Pih Entered By: Angelina Pih on 02/05/2023 13:58:42 -------------------------------------------------------------------------------- Patient/Caregiver Education Details Patient Name: Date of Service: Torelli, Evertte W. 5/2/2024andnbsp2:00 PM Medical Record Number: [id number] Patient Account Number: [id number] Date of Birth/Gender: Treating RN: May 02, 1951 (72 y.o. Laymond Purser Primary Care Physician: Rolin Barry Other Clinician: Referring Physician: Treating  Physician/Extender: Vanessa Ralphs in Treatment: 3 Education Assessment Education Provided To: Patient Education Topics Provided Wound/Skin Impairment: Handouts: Caring for Your Ulcer Methods: Explain/Verbal Responses: State content correctly NEILSON, MELDE ([id number]) [id number] Page 6 of 7 Electronic Signature(s) Signed: 02/05/2023 4:58:56 PM By: Angelina Pih Entered By: Angelina Pih on 02/05/2023 14:30:15 -------------------------------------------------------------------------------- Wound Assessment Details Patient Name: Date of Service: DAMARII, BEATO 02/05/2023 2:00 PM Medical Record Number: [id number] Patient Account Number: [id number] Date of Birth/Sex: Treating RN: 1951/03/07 (72 y.o. Laymond Purser Primary Care Larene Ascencio: Rolin Barry Other Clinician: Referring Cylinda Santoli: Treating Gayanne Prescott/Extender: Vanessa Ralphs in Treatment: 3 Wound Status Wound Number: 1 Primary Etiology: Trauma, Other Wound Location: Right Hand - Palm Wound Status: Open Wounding Event: Trauma Comorbid History: Hypertension, Type II Diabetes, Gout Date Acquired: 01/11/2023 Weeks Of Treatment: 3 Clustered Wound: No Photos Wound Measurements Length: (cm) 1.8 Width: (cm) 0.8 Depth: (cm) 0.2 Area: (cm) 1.131 Volume: (cm) 0.226 % Reduction in Area: 55% % Reduction in Volume: 70% Epithelialization: None Tunneling: No Undermining: No Wound Description Classification: Full Thickness With Exposed Support Structures  Exudate Amount: Medium Exudate Type: Serosanguineous Exudate Color: red, brown Foul Odor After Cleansing: No Slough/Fibrino Yes Wound Bed Granulation Amount: Medium (34-66%) Exposed Structure Granulation Quality: Red, Pink Fat Layer (Subcutaneous Tissue) Exposed: Yes Necrotic Amount: Medium (34-66%) Necrotic Quality: Adherent Slough Treatment Notes Wound #1 (Hand - Palm) Wound Laterality:  Right Cleanser [place]ram Ancillary Kit - 15 Day Supply Discharge Instruction: Use supplies as instructed; Kit contains: (15) Saline Bullets; (15) 3x3 Gauze; 15 pr Gloves Soap and Water Discharge Instruction: Gently cleanse wound with antibacterial soap, rinse and pat dry prior to dressing wounds Peri-Wound Care Topical Primary Dressing Prisma 4.34 (in) LORRIE, VANCE ([id number]) [id number] Page 7 of 7 Discharge Instruction: Moisten w/normal saline or sterile water; Cover wound as directed. Do not remove from wound bed. Secondary Dressing Coverlet Latex-Free Fabric Adhesive Dressings Discharge Instruction: 1.5 x 2 Secured With Compression Wrap Compression Stockings Add-Ons Electronic Signature(s) Signed: 02/05/2023 4:58:56 PM By: Angelina Pih Entered By: Angelina Pih on 02/05/2023 14:04:44 -------------------------------------------------------------------------------- Vitals Details Patient Name: Date of Service: [name] 02/05/2023 2:00 PM Medical Record Number: [id number] Patient Account Number: [id number] Date of Birth/Sex: Treating RN: [birth year] (72 y.o. Laymond Purser Primary Care Lakrisha Iseman: Rolin Barry Other Clinician: Referring Lakresha Stifter: Treating Lashe Oliveira/Extender: Vanessa Ralphs in Treatment: 3 Vital Signs Time Taken: 13:56 Temperature (F): 97.9 Height (in): 68 Pulse (bpm): 74 Weight (lbs): 291 Respiratory Rate (breaths/min): 18 Body Mass Index (BMI): 44.2 Blood Pressure (mmHg): 113/74 Reference Range: 80 - 120 mg / dl Electronic Signature(s) Signed: 02/05/2023 4:58:56 PM By: Angelina Pih Entered By: Angelina Pih on 02/05/2023 13:58:21

## 2023-02-06 NOTE — Progress Notes (Addendum)
KACETON, JHA ([id number]) [id number] Page 1 of 5 Visit Report for 02/05/2023 Chief Complaint Document Details Patient Name: Date of Service: PAL, KIGHTLINGER 02/05/2023 2:00 PM Medical Record Number: [id number] Patient Account Number: [id number] Date of Birth/Sex: Treating RN: 02/12/1951 (72 y.o. Laymond Purser Primary Care Provider: Rolin Barry Other Clinician: Referring Provider: Treating Provider/Extender: Vanessa Ralphs in Treatment: 3 Information Obtained from: Patient Chief Complaint Right hand laceration Electronic Signature(s) Signed: 02/05/2023 1:56:15 PM By: Allen Derry PA-C Entered By: Allen Derry on 02/05/2023 13:56:15 -------------------------------------------------------------------------------- HPI Details Patient Name: Date of Service: [name] 02/05/2023 2:00 PM Medical Record Number: [id number] Patient Account Number: [id number] Date of Birth/Sex: Treating RN: 02/10/1951 (72 y.o. Laymond Purser Primary Care Provider: Rolin Barry Other Clinician: Referring Provider: Treating Provider/Extender: Vanessa Ralphs in Treatment: 3 History of Present Illness HPI Description: 01-15-2023 upon evaluation today patient appears to be doing poorly currently in regard to a wound on his hand. This is actually his right hand right in the middle he was put together a chair on Sunday this past week that was 7 April when he pushed down on a piece putting it together and it pinched his hand and tore some of the tissue out of the middle part of his palm. Fortunately I do not see any signs of active infection systemically nor locally which is great news. Unfortunately he does have a fairly deep wound that goes down towards the muscle layer and is going to take a little bit of time to heal for sure. Patient does have a history of hypertension and diabetes mellitus type 2 but otherwise no major medical  problems. 01-22-2023 upon evaluation today patient appears to be doing well currently in regard to the wound on his hand. I think we are seeing signs of this contracting and being a little bit smaller which is great news already. Fortunately there does not appear to be any signs of active infection locally or systemically which is great news and I am very pleased in that regard. Overall I think he is moving in the right direction. 02-05-2023 upon evaluation today patient appears to be doing well currently in regard to his hand ulcer. He has been tolerating the dressing changes without complication. Fortunately there does not appear to be any signs of active infection at this time which is great news. No fevers, chills, nausea, vomiting, or diarrhea. Electronic Signature(s) Signed: 02/05/2023 2:19:15 PM By: Allen Derry PA-C Entered By: Allen Derry on 02/05/2023 14:19:15 -------------------------------------------------------------------------------- Physical Exam Details Patient Name: Date of Service: STEDMON, HIBDON 02/05/2023 2:00 PM Medical Record Number: [id number] Patient Account Number: [id number] Date of Birth/Sex: Treating RN: 05/09/1951 (72 y.o. Laymond Purser Primary Care Provider: Rolin Barry Other Clinician: Referring Provider: Treating Provider/Extender: Vanessa Ralphs in Treatment: 3 Constitutional Obese and well-hydrated in no acute distress. Respiratory CALIL, VANELLA ([id number]) [id number] Page 2 of 5 normal breathing without difficulty. Psychiatric this patient is able to make decisions and demonstrates good insight into disease process. Alert and Oriented x 3. pleasant and cooperative. Notes Patient's wound did have some of the collagen that was stuck to the base of the wound after softening with the lidocaine it wiped out fairly nicely and currently I feel like he is actually doing extremely well. This is making excellent  progress and very pleased and I do not think there is any need for sharp debridement today. Electronic Signature(s) Signed: 02/05/2023 2:19:27 PM By: Larina Bras,  Aubria Vanecek PA-C Entered By: Allen Derry on 02/05/2023 14:19:27 -------------------------------------------------------------------------------- Physician Orders Details Patient Name: Date of Service: RUEBEN, LINS 02/05/2023 2:00 PM Medical Record Number: [id number] Patient Account Number: [id number] Date of Birth/Sex: Treating RN: 1951-03-16 (72 y.o. Laymond Purser Primary Care Provider: Rolin Barry Other Clinician: Referring Provider: Treating Provider/Extender: Vanessa Ralphs in Treatment: 3 Verbal / Phone Orders: No Diagnosis Coding ICD-10 Coding Code Description [phone] Laceration without foreign body of right hand, initial encounter L98.492 Non-pressure chronic ulcer of skin of other sites with fat layer exposed I10 Essential (primary) hypertension E11.622 Type 2 diabetes mellitus with other skin ulcer Follow-up Appointments Return Appointment in 2 weeks. Bathing/ Shower/ Hygiene May shower; gently cleanse wound with antibacterial soap, rinse and pat dry prior to dressing wounds No tub bath. - no soaking hand in water Anesthetic (Use 'Patient Medications' Section for Anesthetic Order Entry) Lidocaine applied to wound bed Wound Treatment Wound #1 - Hand - Palm Wound Laterality: Right Cleanser: Byram Ancillary Kit - 15 Day Supply (Generic) 3 x Per Week/30 Days Discharge Instructions: Use supplies as instructed; Kit contains: (15) Saline Bullets; (15) 3x3 Gauze; 15 pr Gloves Cleanser: Soap and Water 3 x Per Week/30 Days Discharge Instructions: Gently cleanse wound with antibacterial soap, rinse and pat dry prior to dressing wounds Prim Dressing: Prisma 4.34 (in) 3 x Per Week/30 Days ary Discharge Instructions: Moisten w/normal saline or sterile water; Cover wound as directed. Do not remove from wound  bed. Secondary Dressing: Coverlet Latex-Free Fabric Adhesive Dressings 3 x Per Week/30 Days Discharge Instructions: 1.5 x 2 Electronic Signature(s) Signed: 02/05/2023 2:27:38 PM By: Angelina Pih Signed: 02/05/2023 4:17:31 PM By: Allen Derry PA-C Entered By: Angelina Pih on 02/05/2023 14:27:37 -------------------------------------------------------------------------------- Problem List Details Patient Name: Date of Service: [name] 02/05/2023 2:00 PM Medical Record Number: [id number] Patient Account Number: [id number] KHALIB, KERNS ([id number]) [id number] Page 3 of 5 Date of Birth/Sex: Treating RN: [birth year] (72 y.o. Laymond Purser Primary Care Provider: Other Clinician: Rolin Barry Referring Provider: Treating Provider/Extender: Vanessa Ralphs in Treatment: 3 Active Problems ICD-10 Encounter Code Description Active Date MDM Diagnosis [phone] Laceration without foreign body of right hand, initial encounter 01/15/2023 No Yes L98.492 Non-pressure chronic ulcer of skin of other sites with fat layer exposed 01/15/2023 No Yes I10 Essential (primary) hypertension 01/15/2023 No Yes E11.622 Type 2 diabetes mellitus with other skin ulcer 01/15/2023 No Yes Inactive Problems Resolved Problems Electronic Signature(s) Signed: 02/05/2023 1:56:10 PM By: Allen Derry PA-C Entered By: Allen Derry on 02/05/2023 13:56:10 -------------------------------------------------------------------------------- Progress Note Details Patient Name: Date of Service: [name] 02/05/2023 2:00 PM Medical Record Number: [id number] Patient Account Number: [id number] Date of Birth/Sex: Treating RN: 03-29-1951 (72 y.o. Laymond Purser Primary Care Provider: Rolin Barry Other Clinician: Referring Provider: Treating Provider/Extender: Vanessa Ralphs in Treatment: 3 Subjective Chief Complaint Information obtained from Patient Right  hand laceration History of Present Illness (HPI) 01-15-2023 upon evaluation today patient appears to be doing poorly currently in regard to a wound on his hand. This is actually his right hand right in the middle he was put together a chair on Sunday this past week that was 7 April when he pushed down on a piece putting it together and it pinched his hand and tore some of the tissue out of the middle part of his palm. Fortunately I do not see any signs of active infection systemically nor locally which is great news. Unfortunately he does have  a fairly deep wound that goes down towards the muscle layer and is going to take a little bit of time to heal for sure. Patient does have a history of hypertension and diabetes mellitus type 2 but otherwise no major medical problems. 01-22-2023 upon evaluation today patient appears to be doing well currently in regard to the wound on his hand. I think we are seeing signs of this contracting and being a little bit smaller which is great news already. Fortunately there does not appear to be any signs of active infection locally or systemically which is great news and I am very pleased in that regard. Overall I think he is moving in the right direction. 02-05-2023 upon evaluation today patient appears to be doing well currently in regard to his hand ulcer. He has been tolerating the dressing changes without complication. Fortunately there does not appear to be any signs of active infection at this time which is great news. No fevers, chills, nausea, vomiting, or diarrhea. RASOOL, PENNEL ([id number]) [id number] Page 4 of 5 Objective Constitutional Obese and well-hydrated in no acute distress. Vitals Time Taken: 1:56 PM, Height: 68 in, Weight: 291 lbs, BMI: 44.2, Temperature: 97.9 F, Pulse: 74 bpm, Respiratory Rate: 18 breaths/min, Blood Pressure: 113/74 mmHg. Respiratory normal breathing without difficulty. Psychiatric this patient is  able to make decisions and demonstrates good insight into disease process. Alert and Oriented x 3. pleasant and cooperative. General Notes: Patient's wound did have some of the collagen that was stuck to the base of the wound after softening with the lidocaine it wiped out fairly nicely and currently I feel like he is actually doing extremely well. This is making excellent progress and very pleased and I do not think there is any need for sharp debridement today. Integumentary (Hair, Skin) Wound #1 status is Open. Original cause of wound was Trauma. The date acquired was: 01/11/2023. The wound has been in treatment 3 weeks. The wound is located on the Right Hand - Palm. The wound measures 1.8cm length x 0.8cm width x 0.2cm depth; 1.131cm^2 area and 0.226cm^3 volume. There is Fat Layer (Subcutaneous Tissue) exposed. There is no tunneling or undermining noted. There is a medium amount of serosanguineous drainage noted. There is medium (34-66%) red, pink granulation within the wound bed. There is a medium (34-66%) amount of necrotic tissue within the wound bed including Adherent Slough. Assessment Active Problems ICD-10 Laceration without foreign body of right hand, initial encounter Non-pressure chronic ulcer of skin of other sites with fat layer exposed Essential (primary) hypertension Type 2 diabetes mellitus with other skin ulcer Plan 1. I would recommend that we have the patient continue to monitor for any signs of infection or worsening. I do believe that he is making good progress and would recommend that we continue with the silver collagen dressing. 2. I am also can recommend the patient should continue to monitor for any signs of infection or worsening. Obviously there is no infection right now I am hopeful this will continue as such. We will see patient back for reevaluation in 1 week here in the clinic. If anything worsens or changes patient will contact our office for  additional recommendations. Electronic Signature(s) Signed: 02/05/2023 2:19:52 PM By: Allen Derry PA-C Entered By: Allen Derry on 02/05/2023 14:19:51 -------------------------------------------------------------------------------- SuperBill Details Patient Name: Date of Service: FAIZAN, EDHOLM 02/05/2023 Medical Record Number: [id number] Patient Account Number: [id number] Date of Birth/Sex: Treating RN: January 18, 1951 (72 y.o. Laymond Purser Primary Care Provider: Rolin Barry  Other Clinician: Referring Provider: Treating Provider/Extender: Vanessa Ralphs in Treatment: 3 Diagnosis Coding ICD-10 Codes Code Description [phone] Laceration without foreign body of right hand, initial encounter L98.492 Non-pressure chronic ulcer of skin of other sites with fat layer exposed I10 Essential (primary) hypertension Lewman, Landis Martins ([id number]) [id number] Page 5 of 5 E11.622 Type 2 diabetes mellitus with other skin ulcer Facility Procedures : CPT4 Code: [id number] Description: 99213 - WOUND CARE VISIT-LEV 3 EST PT Modifier: Quantity: 1 Physician Procedures : CPT4 Code Description Modifier [id number] 99213 - WC PHYS LEVEL 3 - EST PT ICD-10 Diagnosis Description S61.411A Laceration without foreign body of right hand, initial encounter L98.492 Non-pressure chronic ulcer of skin of other sites with fat layer  exposed I10 Essential (primary) hypertension E11.622 Type 2 diabetes mellitus with other skin ulcer Quantity: 1 Electronic Signature(s) Signed: 02/05/2023 2:29:19 PM By: Angelina Pih Signed: 02/05/2023 4:17:31 PM By: Allen Derry PA-C Previous Signature: 02/05/2023 2:20:02 PM Version By: Allen Derry PA-C Entered By: Angelina Pih on 02/05/2023 14:29:19

## 2023-02-19 ENCOUNTER — Encounter: Payer: Medicare PPO | Admitting: Physician Assistant

## 2023-02-19 DIAGNOSIS — E11622 Type 2 diabetes mellitus with other skin ulcer: Secondary | ICD-10-CM | POA: Diagnosis not present
